# Patient Record
Sex: Male | Born: 1960 | Race: White | Hispanic: No | Marital: Married | Smoking: Never smoker
Health system: Southern US, Community
[De-identification: ages and names within clinical notes are randomized; demographics above are authoritative.]

## PROBLEM LIST (undated history)

## (undated) DIAGNOSIS — R079 Chest pain, unspecified: Secondary | ICD-10-CM

## (undated) DIAGNOSIS — E782 Mixed hyperlipidemia: Secondary | ICD-10-CM

## (undated) DIAGNOSIS — M150 Primary generalized (osteo)arthritis: Secondary | ICD-10-CM

## (undated) DIAGNOSIS — I214 Non-ST elevation (NSTEMI) myocardial infarction: Secondary | ICD-10-CM

## (undated) DIAGNOSIS — I2510 Atherosclerotic heart disease of native coronary artery without angina pectoris: Secondary | ICD-10-CM

## (undated) DIAGNOSIS — R0683 Snoring: Secondary | ICD-10-CM

## (undated) HISTORY — DX: Mixed hyperlipidemia: E78.2

## (undated) HISTORY — DX: Snoring: R06.83

## (undated) HISTORY — DX: Primary generalized (osteo)arthritis: M15.0

## (undated) HISTORY — DX: Chest pain, unspecified: R07.9

---
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
## 2015-04-24 DIAGNOSIS — E782 Mixed hyperlipidemia: Secondary | ICD-10-CM | POA: Insufficient documentation

## 2015-04-24 HISTORY — DX: Mixed hyperlipidemia: E78.2

## 2015-04-25 DIAGNOSIS — G4733 Obstructive sleep apnea (adult) (pediatric): Secondary | ICD-10-CM

## 2015-04-25 DIAGNOSIS — R0683 Snoring: Secondary | ICD-10-CM

## 2015-04-25 DIAGNOSIS — M8949 Other hypertrophic osteoarthropathy, multiple sites: Secondary | ICD-10-CM

## 2015-04-25 DIAGNOSIS — M150 Primary generalized (osteo)arthritis: Secondary | ICD-10-CM

## 2015-04-25 DIAGNOSIS — M159 Polyosteoarthritis, unspecified: Secondary | ICD-10-CM

## 2015-04-25 HISTORY — DX: Snoring: R06.83

## 2015-04-25 HISTORY — DX: Other hypertrophic osteoarthropathy, multiple sites: M89.49

## 2015-04-25 HISTORY — DX: Polyosteoarthritis, unspecified: M15.9

## 2015-04-25 HISTORY — DX: Primary generalized (osteo)arthritis: M15.0

## 2015-04-25 HISTORY — DX: Obstructive sleep apnea (adult) (pediatric): G47.33

## 2016-08-04 DIAGNOSIS — R079 Chest pain, unspecified: Secondary | ICD-10-CM

## 2016-08-04 HISTORY — DX: Chest pain, unspecified: R07.9

## 2016-08-05 ENCOUNTER — Emergency Department (HOSPITAL_COMMUNITY): Payer: BLUE CROSS/BLUE SHIELD

## 2016-08-05 ENCOUNTER — Encounter (HOSPITAL_COMMUNITY)
Admission: EM | Disposition: A | Discharge status: Home / Self Care | Payer: Self-pay | Source: Home / Self Care | Attending: Cardiovascular Disease

## 2016-08-05 ENCOUNTER — Ambulatory Visit: Payer: Self-pay | Admitting: Cardiology

## 2016-08-05 ENCOUNTER — Observation Stay (HOSPITAL_COMMUNITY)
Admission: EM | Admit: 2016-08-05 | Discharge: 2016-08-06 | Disposition: A | Discharge status: Home / Self Care | Payer: BLUE CROSS/BLUE SHIELD | Attending: Cardiovascular Disease | Admitting: Cardiovascular Disease

## 2016-08-05 ENCOUNTER — Encounter (HOSPITAL_COMMUNITY): Payer: Self-pay | Admitting: *Deleted

## 2016-08-05 DIAGNOSIS — E782 Mixed hyperlipidemia: Secondary | ICD-10-CM | POA: Diagnosis not present

## 2016-08-05 DIAGNOSIS — I25110 Atherosclerotic heart disease of native coronary artery with unstable angina pectoris: Secondary | ICD-10-CM | POA: Diagnosis not present

## 2016-08-05 DIAGNOSIS — I2510 Atherosclerotic heart disease of native coronary artery without angina pectoris: Secondary | ICD-10-CM | POA: Diagnosis present

## 2016-08-05 DIAGNOSIS — Z955 Presence of coronary angioplasty implant and graft: Secondary | ICD-10-CM

## 2016-08-05 DIAGNOSIS — Z8249 Family history of ischemic heart disease and other diseases of the circulatory system: Secondary | ICD-10-CM | POA: Diagnosis not present

## 2016-08-05 DIAGNOSIS — I200 Unstable angina: Secondary | ICD-10-CM

## 2016-08-05 DIAGNOSIS — M1991 Primary osteoarthritis, unspecified site: Secondary | ICD-10-CM | POA: Insufficient documentation

## 2016-08-05 DIAGNOSIS — I214 Non-ST elevation (NSTEMI) myocardial infarction: Principal | ICD-10-CM

## 2016-08-05 DIAGNOSIS — Z9861 Coronary angioplasty status: Secondary | ICD-10-CM

## 2016-08-05 HISTORY — DX: Non-ST elevation (NSTEMI) myocardial infarction: I21.4

## 2016-08-05 HISTORY — PX: LEFT HEART CATH AND CORONARY ANGIOGRAPHY: CATH118249

## 2016-08-05 HISTORY — PX: CORONARY ANGIOPLASTY WITH STENT PLACEMENT: SHX49

## 2016-08-05 HISTORY — PX: CORONARY STENT INTERVENTION: CATH118234

## 2016-08-05 HISTORY — DX: Atherosclerotic heart disease of native coronary artery without angina pectoris: I25.10

## 2016-08-05 LAB — BASIC METABOLIC PANEL
ANION GAP: 8 (ref 5–15)
BUN: 20 mg/dL (ref 6–20)
CALCIUM: 9.2 mg/dL (ref 8.9–10.3)
CO2: 28 mmol/L (ref 22–32)
Chloride: 104 mmol/L (ref 101–111)
Creatinine, Ser: 1.12 mg/dL (ref 0.61–1.24)
Glucose, Bld: 114 mg/dL — ABNORMAL HIGH (ref 65–99)
Potassium: 4.4 mmol/L (ref 3.5–5.1)
SODIUM: 140 mmol/L (ref 135–145)

## 2016-08-05 LAB — CBC
HCT: 46.4 % (ref 39.0–52.0)
HEMOGLOBIN: 15.8 g/dL (ref 13.0–17.0)
MCH: 30 pg (ref 26.0–34.0)
MCHC: 34.1 g/dL (ref 30.0–36.0)
MCV: 88 fL (ref 78.0–100.0)
Platelets: 172 10*3/uL (ref 150–400)
RBC: 5.27 MIL/uL (ref 4.22–5.81)
RDW: 13.4 % (ref 11.5–15.5)
WBC: 7.2 10*3/uL (ref 4.0–10.5)

## 2016-08-05 LAB — POCT ACTIVATED CLOTTING TIME: Activated Clotting Time: 549 seconds

## 2016-08-05 LAB — I-STAT TROPONIN, ED: TROPONIN I, POC: 0.22 ng/mL — AB (ref 0.00–0.08)

## 2016-08-05 LAB — TROPONIN I
TROPONIN I: 0.37 ng/mL — AB (ref <0.03)
Troponin I: 0.59 ng/mL (ref <0.03)

## 2016-08-05 SURGERY — LEFT HEART CATH AND CORONARY ANGIOGRAPHY
Anesthesia: LOCAL

## 2016-08-05 MED ORDER — SODIUM CHLORIDE 0.9% FLUSH: 3.0000 mL | INTRAVENOUS | Status: DC | PRN

## 2016-08-05 MED ORDER — MORPHINE SULFATE (PF) 4 MG/ML IV SOLN: 2.0000 mg | INTRAVENOUS | Status: DC | PRN

## 2016-08-05 MED ORDER — LABETALOL HCL 5 MG/ML IV SOLN: 10.0000 mg | INTRAVENOUS | Status: DC | PRN

## 2016-08-05 MED ORDER — HEART ATTACK BOUNCING BOOK
Freq: Once | Status: AC
  Administered 2016-08-06: 02:00:00
  Filled 2016-08-05: qty 1

## 2016-08-05 MED ORDER — BIVALIRUDIN TRIFLUOROACETATE 250 MG IV SOLR
INTRAVENOUS | Status: AC
  Filled 2016-08-05: qty 250

## 2016-08-05 MED ORDER — SODIUM CHLORIDE 0.9% FLUSH
3.0000 mL | Freq: Two times a day (BID) | INTRAVENOUS | Status: DC
  Administered 2016-08-06: 02:00:00 3 mL via INTRAVENOUS

## 2016-08-05 MED ORDER — ATORVASTATIN CALCIUM 40 MG PO TABS
40.0000 mg | ORAL_TABLET | Freq: Every day | ORAL | Status: DC
  Administered 2016-08-05: 40 mg via ORAL
  Filled 2016-08-05: qty 1

## 2016-08-05 MED ORDER — TICAGRELOR 90 MG PO TABS
90.0000 mg | ORAL_TABLET | Freq: Two times a day (BID) | ORAL | Status: DC
  Administered 2016-08-06 (×2): 90 mg via ORAL
  Filled 2016-08-05 (×2): qty 1

## 2016-08-05 MED ORDER — SODIUM CHLORIDE 0.9 % IV SOLN
1.7500 mg/kg/h | INTRAVENOUS | Status: DC
  Administered 2016-08-05: 16:00:00 1.75 mg/kg/h via INTRAVENOUS
  Filled 2016-08-05: qty 250

## 2016-08-05 MED ORDER — IOPAMIDOL (ISOVUE-370) INJECTION 76%
INTRAVENOUS | Status: AC
  Filled 2016-08-05: qty 100

## 2016-08-05 MED ORDER — HYDRALAZINE HCL 20 MG/ML IJ SOLN
5.0000 mg | INTRAMUSCULAR | Status: DC | PRN
  Administered 2016-08-05: 17:00:00 5 mg via INTRAVENOUS
  Filled 2016-08-05: qty 1

## 2016-08-05 MED ORDER — SODIUM CHLORIDE 0.9 % WEIGHT BASED INFUSION: 3.0000 mL/kg/h | INTRAVENOUS | Status: DC

## 2016-08-05 MED ORDER — FENTANYL CITRATE (PF) 100 MCG/2ML IJ SOLN
INTRAMUSCULAR | Status: AC
  Filled 2016-08-05: qty 2

## 2016-08-05 MED ORDER — SODIUM CHLORIDE 0.9% FLUSH: 3.0000 mL | Freq: Two times a day (BID) | INTRAVENOUS | Status: DC

## 2016-08-05 MED ORDER — HEPARIN SODIUM (PORCINE) 1000 UNIT/ML IJ SOLN
INTRAMUSCULAR | Status: DC | PRN
  Administered 2016-08-05: 5000 [IU] via INTRAVENOUS

## 2016-08-05 MED ORDER — MIDAZOLAM HCL 2 MG/2ML IJ SOLN
INTRAMUSCULAR | Status: AC
  Filled 2016-08-05: qty 2

## 2016-08-05 MED ORDER — LIDOCAINE HCL (PF) 1 % IJ SOLN
INTRAMUSCULAR | Status: DC | PRN
  Administered 2016-08-05: 2 mL

## 2016-08-05 MED ORDER — TICAGRELOR 90 MG PO TABS
ORAL_TABLET | ORAL | Status: DC | PRN
  Administered 2016-08-05: 180 mg via ORAL

## 2016-08-05 MED ORDER — ACETAMINOPHEN 325 MG PO TABS: 650.0000 mg | ORAL_TABLET | ORAL | Status: DC | PRN

## 2016-08-05 MED ORDER — ASPIRIN 81 MG PO CHEW
324.0000 mg | CHEWABLE_TABLET | Freq: Once | ORAL | Status: AC
  Administered 2016-08-05: 324 mg via ORAL
  Filled 2016-08-05: qty 4

## 2016-08-05 MED ORDER — SODIUM CHLORIDE 0.9 % IV SOLN: 250.0000 mL | INTRAVENOUS | Status: DC | PRN

## 2016-08-05 MED ORDER — SODIUM CHLORIDE 0.9 % IV SOLN: INTRAVENOUS | Status: AC

## 2016-08-05 MED ORDER — HEPARIN BOLUS VIA INFUSION
4000.0000 [IU] | Freq: Once | INTRAVENOUS | Status: AC
  Administered 2016-08-05: 4000 [IU] via INTRAVENOUS
  Filled 2016-08-05: qty 4000

## 2016-08-05 MED ORDER — NITROGLYCERIN 0.4 MG SL SUBL: 0.4000 mg | SUBLINGUAL_TABLET | SUBLINGUAL | Status: DC | PRN

## 2016-08-05 MED ORDER — SODIUM CHLORIDE 0.9 % IV SOLN
INTRAVENOUS | Status: AC | PRN
  Administered 2016-08-05 (×2): 1.75 mg/kg/h via INTRAVENOUS

## 2016-08-05 MED ORDER — VERAPAMIL HCL 2.5 MG/ML IV SOLN
INTRA_ARTERIAL | Status: DC | PRN
  Administered 2016-08-05: 5 mL via INTRA_ARTERIAL

## 2016-08-05 MED ORDER — VERAPAMIL HCL 2.5 MG/ML IV SOLN
INTRAVENOUS | Status: AC
  Filled 2016-08-05: qty 2

## 2016-08-05 MED ORDER — ASPIRIN 300 MG RE SUPP: 300.0000 mg | RECTAL | Status: DC

## 2016-08-05 MED ORDER — ASPIRIN 81 MG PO CHEW: 324.0000 mg | CHEWABLE_TABLET | ORAL | Status: DC

## 2016-08-05 MED ORDER — ANGIOPLASTY BOOK
Freq: Once | Status: AC
  Administered 2016-08-06: 02:00:00
  Filled 2016-08-05: qty 1

## 2016-08-05 MED ORDER — BIVALIRUDIN BOLUS VIA INFUSION - CUPID
INTRAVENOUS | Status: DC | PRN
  Administered 2016-08-05: 78.225 mg via INTRAVENOUS

## 2016-08-05 MED ORDER — ASPIRIN EC 81 MG PO TBEC
81.0000 mg | DELAYED_RELEASE_TABLET | Freq: Every day | ORAL | Status: DC
  Administered 2016-08-06: 81 mg via ORAL
  Filled 2016-08-05: qty 1

## 2016-08-05 MED ORDER — MIDAZOLAM HCL 2 MG/2ML IJ SOLN
INTRAMUSCULAR | Status: DC | PRN
  Administered 2016-08-05 (×2): 1 mg via INTRAVENOUS

## 2016-08-05 MED ORDER — FENTANYL CITRATE (PF) 100 MCG/2ML IJ SOLN
INTRAMUSCULAR | Status: DC | PRN
  Administered 2016-08-05 (×2): 25 ug via INTRAVENOUS

## 2016-08-05 MED ORDER — HEPARIN (PORCINE) IN NACL 100-0.45 UNIT/ML-% IJ SOLN
1200.0000 [IU]/h | INTRAMUSCULAR | Status: DC
  Administered 2016-08-05: 1200 [IU]/h via INTRAVENOUS
  Filled 2016-08-05: qty 250

## 2016-08-05 MED ORDER — HEPARIN SODIUM (PORCINE) 1000 UNIT/ML IJ SOLN
INTRAMUSCULAR | Status: AC
  Filled 2016-08-05: qty 1

## 2016-08-05 MED ORDER — SODIUM CHLORIDE 0.9 % WEIGHT BASED INFUSION: 1.0000 mL/kg/h | INTRAVENOUS | Status: DC

## 2016-08-05 MED ORDER — ASPIRIN 81 MG PO CHEW: 81.0000 mg | CHEWABLE_TABLET | Freq: Every day | ORAL | Status: DC

## 2016-08-05 MED ORDER — NITROGLYCERIN 1 MG/10 ML FOR IR/CATH LAB
INTRA_ARTERIAL | Status: DC | PRN
  Administered 2016-08-05 (×3): 200 ug via INTRACORONARY

## 2016-08-05 MED ORDER — IOPAMIDOL (ISOVUE-370) INJECTION 76%
INTRAVENOUS | Status: DC | PRN
  Administered 2016-08-05: 175 mL via INTRAVENOUS

## 2016-08-05 MED ORDER — TICAGRELOR 90 MG PO TABS
ORAL_TABLET | ORAL | Status: AC
  Filled 2016-08-05: qty 2

## 2016-08-05 MED ORDER — ONDANSETRON HCL 4 MG/2ML IJ SOLN: 4.0000 mg | Freq: Four times a day (QID) | INTRAMUSCULAR | Status: DC | PRN

## 2016-08-05 MED ORDER — LIDOCAINE HCL (PF) 1 % IJ SOLN
INTRAMUSCULAR | Status: AC
  Filled 2016-08-05: qty 30

## 2016-08-05 MED ORDER — NITROGLYCERIN 1 MG/10 ML FOR IR/CATH LAB
INTRA_ARTERIAL | Status: AC
  Filled 2016-08-05: qty 10

## 2016-08-05 MED ORDER — HEPARIN (PORCINE) IN NACL 2-0.9 UNIT/ML-% IJ SOLN
INTRAMUSCULAR | Status: AC | PRN
  Administered 2016-08-05: 1000 mL

## 2016-08-05 MED ORDER — HEPARIN (PORCINE) IN NACL 2-0.9 UNIT/ML-% IJ SOLN
INTRAMUSCULAR | Status: AC
  Filled 2016-08-05: qty 1000

## 2016-08-05 MED ORDER — ASPIRIN 81 MG PO CHEW: 81.0000 mg | CHEWABLE_TABLET | ORAL | Status: DC

## 2016-08-05 SURGICAL SUPPLY — 19 items
BALLN EMERGE MR 2.0X12 (BALLOONS) ×2
BALLOON EMERGE MR 2.0X12 (BALLOONS) ×1 IMPLANT
CATH INFINITI 5FR ANG PIGTAIL (CATHETERS) ×2 IMPLANT
CATH OPTITORQUE TIG 4.0 5F (CATHETERS) ×2 IMPLANT
CATH VISTA GUIDE 6FR JR4 (CATHETERS) ×2 IMPLANT
DEVICE RAD COMP TR BAND LRG (VASCULAR PRODUCTS) ×2 IMPLANT
GLIDESHEATH SLEND A-KIT 6F 22G (SHEATH) ×2 IMPLANT
GUIDEWIRE INQWIRE 1.5J.035X260 (WIRE) ×1 IMPLANT
INQWIRE 1.5J .035X260CM (WIRE) ×2
KIT ENCORE 26 ADVANTAGE (KITS) ×2 IMPLANT
KIT HEART LEFT (KITS) ×2 IMPLANT
PACK CARDIAC CATHETERIZATION (CUSTOM PROCEDURE TRAY) ×2 IMPLANT
STENT SYNERGY DES 3.5X16 (Permanent Stent) ×2 IMPLANT
STENT SYNERGY DES 3.5X20 (Permanent Stent) ×2 IMPLANT
SYR MEDRAD MARK V 150ML (SYRINGE) ×2 IMPLANT
TRANSDUCER W/STOPCOCK (MISCELLANEOUS) ×2 IMPLANT
TUBING CIL FLEX 10 FLL-RA (TUBING) ×2 IMPLANT
WIRE ASAHI PROWATER 180CM (WIRE) ×2 IMPLANT
WIRE HI TORQ VERSACORE-J 145CM (WIRE) ×2 IMPLANT

## 2016-08-05 NOTE — ED Provider Notes (Signed)
[facility]ENCY DEPT Provider Note   CSN: [id number] Arrival date & time: 08/05/16  0731     History   Chief Complaint Chief Complaint  Patient presents with  . Chest Pain    HPI Keith Hunter is a 56 y.o. male.   Chest Pain   This is a new problem. The current episode started more than 2 days ago. The problem occurs daily. The pain is associated with exertion. The pain is present in the substernal region. The pain is severe. The quality of the pain is described as exertional. The pain radiates to the left shoulder. The symptoms are aggravated by exertion.    Past Medical History:  Diagnosis Date  . Chest pain in adult 08/04/2016  . Mixed hyperlipidemia 04/24/2015   Last Assessment & Plan:  Relevant Hx: Course: Daily Update: Today's Plan:this is going to be updated today and will see how he is with this  Electronically signed by: Krystal ClarkMelissa Joyce Brown-Patram, NP 04/25/15 1119  . Primary osteoarthritis involving multiple joints 04/25/2015   Last Assessment & Plan:  Relevant Hx: Course: Daily Update: Today's Plan:discussed this in depth with him and he is using some OTC meds for this when he golfs and he has tried glucosamine which did not seem to help him much at all  Electronically signed by: Krystal ClarkMelissa Joyce Brown-Patram, NP 04/25/15 1118  . Snoring 04/25/2015   Last Assessment & Plan:  Relevant Hx: Course: Daily Update: Today's Plan:long discussion with him about this and he is advised that he should have sleep study which he does not want to and is going to investigate having mouth piece  Electronically signed by: Krystal ClarkMelissa Joyce Brown-Patram, NP 04/25/15 1122    Patient Active Problem List   Diagnosis Date Noted  . Chest pain in adult 08/04/2016  . Primary osteoarthritis involving multiple joints 04/25/2015  . Snoring 04/25/2015  . Mixed hyperlipidemia 04/24/2015    History reviewed. No pertinent surgical history.     Home Medications    Prior to Admission medications   Not on  File    Family History No family history on file.  Social History Social History  Substance Use Topics  . Smoking status: Never Smoker  . Smokeless tobacco: Never Used  . Alcohol use No     Allergies   Patient has no known allergies.   Review of Systems Review of Systems  Cardiovascular: Positive for chest pain.  All other systems reviewed and are negative.    Physical Exam Updated Vital Signs Pulse 76   Temp 97.8 F (36.6 C)   Resp 18   Ht 5\' 10"  (1.778 m)   Wt 104.3 kg (230 lb)   SpO2 100%   BMI 33.00 kg/m   Physical Exam  Constitutional: He is oriented to person, place, and time. He appears well-developed and well-nourished.  HENT:  Head: Normocephalic and atraumatic.  Eyes: Conjunctivae and EOM are normal.  Neck: Normal range of motion.  Cardiovascular: Normal rate.   Pulmonary/Chest: Effort normal. No respiratory distress.  Abdominal: Soft. Bowel sounds are normal. He exhibits no distension.  Musculoskeletal: Normal range of motion.  Neurological: He is alert and oriented to person, place, and time.  Skin: Skin is warm and dry.  Nursing note and vitals reviewed.    ED Treatments / Results  Labs (all labs ordered are listed, but only abnormal results are displayed) Labs Reviewed  BASIC METABOLIC PANEL - Abnormal; Notable for the following:       Result Value  Glucose, Bld 114 (*)    All other components within normal limits  I-STAT TROPOININ, ED - Abnormal; Notable for the following:    Troponin i, poc 0.22 (*)    All other components within normal limits  CBC  HEPARIN LEVEL (UNFRACTIONATED)    EKG  EKG Interpretation  Date/Time:  Wednesday August 05 2016 07:46:41 EDT Ventricular Rate:  73 PR Interval:    QRS Duration: 95 QT Interval:  365 QTC Calculation: 403 R Axis:   -8 Text Interpretation:  Age not entered, assumed to be  56 years old for purpose of ECG interpretation Sinus rhythm Low voltage, precordial leads TWI in III,  nonspecific in avF No old tracing to compare Confirmed by Marily Memos [id number]) on 08/05/2016 7:55:24 AM       Radiology Dg Chest 2 View  Result Date: 08/05/2016 CLINICAL DATA:  Chest pain. EXAM: CHEST  2 VIEW COMPARISON:  No prior . FINDINGS: Mediastinum and hilar structures normal. Heart size normal. No focal infiltrate. No pleural effusion or pneumothorax. Surgical screw noted over the right shoulder . IMPRESSION: No acute cardiopulmonary disease. Electronically Signed   By: Maisie Fus  Register   On: 08/05/2016 08:32    Procedures Procedures (including critical care time)  CRITICAL CARE Performed by: Marily Memos Total critical care time: 35 minutes Critical care time was exclusive of separately billable procedures and treating other patients. Critical care was necessary to treat or prevent imminent or life-threatening deterioration. Critical care was time spent personally by me on the following activities: development of treatment plan with patient and/or surrogate as well as nursing, discussions with consultants, evaluation of patient's response to treatment, examination of patient, obtaining history from patient or surrogate, ordering and performing treatments and interventions, ordering and review of laboratory studies, ordering and review of radiographic studies, pulse oximetry and re-evaluation of patient's condition.   Medications Ordered in ED Medications  aspirin chewable tablet 324 mg (not administered)  heparin bolus via infusion 4,000 Units (not administered)  heparin ADULT infusion 100 units/mL (25000 units/252mL sodium chloride 0.45%) (not administered)     Initial Impression / Assessment and Plan / ED Course  I have reviewed the triage vital signs and the nursing notes.  Pertinent labs & imaging results that were available during my care of the patient were reviewed by me and considered in my medical decision making (see chart for details).  Clinical Course as of Aug 06 850  Wed Aug 05, 2016  0815 ED EKG within 10 minutes [DP]  0815 EKG 12-Lead [DP]    Clinical Course User Index [DP] Plocki, Dylan T, Student-PA    NSTEMI. Heparin started. Plan for cardiology consult and admission.   Final Clinical Impressions(s) / ED Diagnoses   Final diagnoses:  NSTEMI (non-ST elevated myocardial infarction) (HCC)     Shayla Heming, Barbara Cower, MD 08/05/16 1658

## 2016-08-05 NOTE — ED Notes (Signed)
Pt transported to cath lab with RN and NT following cath lab call for patient. Due to pt movement within the ED prior to Consent not signed prior to transport as it would have led to a delay in transport.

## 2016-08-05 NOTE — Care Management Note (Signed)
Case Management Note  Patient Details  Name: Keith Hunter MRN: [id number] Date of Birth: 07/07/1960  Subjective/Objective:  From home with wife, pta indep,  elevated trops, NSTEMI, s/p coronary stent, will be on brilinta, NCM awaiting benefit check.                   Action/Plan: NCM will follow for dc needs.   Expected Discharge Date:                  Expected Discharge Plan:  Home/Self Care  In-House Referral:     Discharge planning Services  CM Consult  Post Acute Care Choice:    Choice offered to:     DME Arranged:    DME Agency:     HH Arranged:    HH Agency:     Status of Service:  In process, will continue to follow  If discussed at Long Length of Stay Meetings, dates discussed:    Additional Comments:  [name], [name], RN 08/05/2016, 3:45 PM

## 2016-08-05 NOTE — H&P (Signed)
History & Physical    Patient ID: Keith Hunter MRN: [id number], DOB/AGE: [birth year]   Admit date: 08/05/2016   Primary Physician: Gordan Payment., MD Primary Cardiologist: New  Patient Profile    56 yo male with no significant PMH who presented with exertional chest pain and dyspnea.   Past Medical History   Past Medical History:  Diagnosis Date  . Chest pain in adult 08/04/2016  . Mixed hyperlipidemia 04/24/2015   Last Assessment & Plan:  Relevant Hx: Course: Daily Update: Today's Plan:this is going to be updated today and will see how he is with this  Electronically signed by: Krystal Clark, NP 04/25/15 1119  . Primary osteoarthritis involving multiple joints 04/25/2015   Last Assessment & Plan:  Relevant Hx: Course: Daily Update: Today's Plan:discussed this in depth with him and he is using some OTC meds for this when he golfs and he has tried glucosamine which did not seem to help him much at all  Electronically signed by: Krystal Clark, NP 04/25/15 1118  . Snoring 04/25/2015   Last Assessment & Plan:  Relevant Hx: Course: Daily Update: Today's Plan:long discussion with him about this and he is advised that he should have sleep study which he does not want to and is going to investigate having mouth piece  Electronically signed by: Krystal Clark, NP 04/25/15 1122    History reviewed. No pertinent surgical history.   Allergies  No Known Allergies  History of Present Illness    56 yo male with no significant PMH, but follows routinely with PCP. Denies any tobacco use. Strong family hx of CAD with both father and brother having stents, brother was in his 15s. Currently works as a Facilities manager. Does not normally experience any exertional symptoms. He was playing golf on Sunday, while walking up hill on the course developed chest pressure with tightness radiating up into his neck. Relieved by rest. Felt it was GERD at the time. Then had recurrence  of symptoms Monday night while walking outside with his wife. Presented to his PCP office yesterday, and had labs done. Called this morning and told his labs were abnormal and to come to the ED.   In the ED labs showed trop 0.2, Cr 1.12, Hgb 15.8. EKG showed SR with TWI/Q waves in lead III. CXR negative. He was placed on IV heparin. No current chest pain.   Home Medications    Prior to Admission medications   Not on File    Family History    Family History  Problem Relation Age of Onset  . Heart disease Father   . Heart disease Brother     Social History    Social History   Social History  . Marital status: Married    Spouse name: N/A  . Number of children: N/A  . Years of education: N/A   Occupational History  . Not on file.   Social History Main Topics  . Smoking status: Never Smoker  . Smokeless tobacco: Never Used  . Alcohol use No  . Drug use: No  . Sexual activity: Not on file   Other Topics Concern  . Not on file   Social History Narrative  . No narrative on file     Review of Systems    See HPI  All other systems reviewed and are otherwise negative except as noted above.  Physical Exam    Blood pressure 127/90, pulse 70, temperature 97.8 F (36.6 C), resp.  rate 17, height 5\' 10"  (1.778 m), weight 230 lb (104.3 kg), SpO2 98 %.  General: Pleasant, NAD Psych: Normal affect. Neuro: Alert and oriented X 3. Moves all extremities spontaneously. HEENT: Normal  Neck: Supple without bruits or JVD. Lungs:  Resp regular and unlabored, CTA. Heart: RRR no s3, s4, or murmurs. Abdomen: Soft, non-tender, non-distended, BS + x 4.  Extremities: No clubbing, cyanosis or edema. DP/PT/Radials 2+ and equal bilaterally.  Labs    Troponin [facility](Point of Care Test)  Recent Labs  08/05/16 0803  TROPIPOC 0.22*   No results for input(s): CKTOTAL, CKMB, TROPONINI in the last 72 hours. Lab Results  Component Value Date   WBC 7.2 08/05/2016   HGB 15.8 08/05/2016    HCT 46.4 08/05/2016   MCV 88.0 08/05/2016   PLT 172 08/05/2016     Recent Labs Lab 08/05/16 0750  NA 140  K 4.4  CL 104  CO2 28  BUN 20  CREATININE 1.12  CALCIUM 9.2  GLUCOSE 114*   No results found for: CHOL, HDL, LDLCALC, TRIG No results found for: [facility]   Radiology Studies    Dg Chest 2 View  Result Date: 08/05/2016 CLINICAL DATA:  Chest pain. EXAM: CHEST  2 VIEW COMPARISON:  No prior . FINDINGS: Mediastinum and hilar structures normal. Heart size normal. No focal infiltrate. No pleural effusion or pneumothorax. Surgical screw noted over the right shoulder . IMPRESSION: No acute cardiopulmonary disease. Electronically Signed   By: Maisie Fushomas  Register   On: 08/05/2016 08:32    ECG & Cardiac Imaging    EKG: SR with TWI, Q waves in lead III  Assessment & Plan    56 yo male with no significant PMH who presented with exertional chest pain and dyspnea.   1. NSTEMI/chest pain: Reports 2 separate episodes of exertional chest pain/tightness with radiation into his neck. Felt it was GERD. Went to PCP and had labs done with elevated troponin. Instructed to come to the ED. Initial Trop 0.22. Significant family Hx with brother/father having stents.  -- plan for LHC today. The patient understands that risks included but are not limited to stroke (1 in 1000), death (1 in 1000), kidney failure [usually temporary] (1 in 500), bleeding (1 in 200), allergic reaction [possibly serious] (1 in 200).  -- IV heparin -- Hgb A1c was 5.8  2. HL: Labs from PCP office recently showed LDL 128. Will start statin therapy with Lipitor 40mg  daily  Signed, Laverda PageLindsay Roberts, NP-C Pager [phone] 08/05/2016, 9:38 AM   Agree with note by Laverda PageLindsay Roberts NP-C  Patient with positive risk factors, new onset exertional chest pain, slightly abnormal EKG and minimally abnormal troponin. His exam is benign. I believe the best diagnostic strategy would be coronary angiography. The patient understands that  risks included but are not limited to stroke (1 in 1000), death (1 in 1000), kidney failure [usually temporary] (1 in 500), bleeding (1 in 200), allergic reaction [possibly serious] (1 in 200). The patient understands and agrees to proceed   Runell GessJonathan J. Hilaria Titsworth, M.D., FACP, [facility] CenterFACC, Kathryne ErikssonFAHA, FSCAI [facility] [address] [place], [place]  [id number]  [phone] 08/05/2016 1:34 PM

## 2016-08-05 NOTE — Progress Notes (Signed)
ANTICOAGULATION CONSULT NOTE - Initial Consult  Pharmacy Consult for heparin Indication: chest pain/ACS  No Known Allergies  Patient Measurements: Height: 5\' 10"  (177.8 cm) Weight: 230 lb (104.3 kg) IBW/kg (Calculated) : 73 Heparin Dosing Weight: 95.2  Vital Signs: Temp: 97.8 F (36.6 C) (07/18 0743) Pulse Rate: 76 (07/18 0743)  Labs:  Recent Labs  08/05/16 0750  HGB 15.8  HCT 46.4  PLT 172  CREATININE 1.12    Estimated Creatinine Clearance: 91.2 mL/min (by C-G formula based on SCr of 1.12 mg/dL).   Medical History: Past Medical History:  Diagnosis Date  . Chest pain in adult 08/04/2016  . Mixed hyperlipidemia 04/24/2015   Last Assessment & Plan:  Relevant Hx: Course: Daily Update: Today's Plan:this is going to be updated today and will see how he is with this  Electronically signed by: Krystal ClarkMelissa Joyce Brown-Patram, NP 04/25/15 1119  . Primary osteoarthritis involving multiple joints 04/25/2015   Last Assessment & Plan:  Relevant Hx: Course: Daily Update: Today's Plan:discussed this in depth with him and he is using some OTC meds for this when he golfs and he has tried glucosamine which did not seem to help him much at all  Electronically signed by: Krystal ClarkMelissa Joyce Brown-Patram, NP 04/25/15 1118  . Snoring 04/25/2015   Last Assessment & Plan:  Relevant Hx: Course: Daily Update: Today's Plan:long discussion with him about this and he is advised that he should have sleep study which he does not want to and is going to investigate having mouth piece  Electronically signed by: Krystal ClarkMelissa Joyce Brown-Patram, NP 04/25/15 1122    Medications:  Infusions:  . heparin      Assessment: 56 YOM presenting with chest pain with consult for heparin.      Goal of Therapy:  Heparin level 0.3-0.7 units/ml Monitor platelets by anticoagulation protocol: Yes   Plan:  Start heparin 4000 units x 1 Start heparin gtt 1200 units/hr Heparin level @ 1600 Daily heparin level, CBC, monitor s/s  bleeding  Daylene PoseyJonathan Hashem Goynes, PharmD Pharmacy Resident Pager #: [phone] 08/05/2016 10:14 AM

## 2016-08-05 NOTE — ED Triage Notes (Addendum)
Pt was seen by pcp after experiencing chest pain while walking up hill on Sunday and Monday.  Md drew labs yesterday and told him to come in b/c "troponin was elevated" per pt.  States L shoulder pain x 1 year.

## 2016-08-05 NOTE — Progress Notes (Signed)
#   5 S/W JENAE @ [facility] # [phone]   BRILINTA 90 MG DAILY   COVER- YES  CO-PAY- $ 55.00  TIER- 3 DRUG  PRIOR APPROVAL- NO   PHARMACY : [facility]

## 2016-08-05 NOTE — Interval H&P Note (Signed)
Cath Lab Visit (complete for each Cath Lab visit)  Clinical Evaluation Leading to the Procedure:   ACS: Yes.    Non-ACS:    Anginal Classification: CCS III  Anti-ischemic medical therapy: No Therapy  Non-Invasive Test Results: No non-invasive testing performed  Prior CABG: No previous CABG      History and Physical Interval Note:  08/05/2016 1:37 PM  Shaul Amundson  has presented today for surgery, with the diagnosis of unstable angina  The various methods of treatment have been discussed with the patient and family. After consideration of risks, benefits and other options for treatment, the patient has consented to  Procedure(s): Left Heart Cath and Coronary Angiography (N/A) as a surgical intervention .  The patient's history has been reviewed, patient examined, no change in status, stable for surgery.  I have reviewed the patient's chart and labs.  Questions were answered to the patient's satisfaction.     Nanetta BattyBerry, Kambri Dismore

## 2016-08-06 ENCOUNTER — Encounter (HOSPITAL_COMMUNITY): Payer: Self-pay | Admitting: Cardiovascular Disease

## 2016-08-06 DIAGNOSIS — I200 Unstable angina: Secondary | ICD-10-CM | POA: Diagnosis not present

## 2016-08-06 DIAGNOSIS — Z8249 Family history of ischemic heart disease and other diseases of the circulatory system: Secondary | ICD-10-CM | POA: Diagnosis not present

## 2016-08-06 DIAGNOSIS — I25110 Atherosclerotic heart disease of native coronary artery with unstable angina pectoris: Secondary | ICD-10-CM | POA: Diagnosis not present

## 2016-08-06 DIAGNOSIS — I214 Non-ST elevation (NSTEMI) myocardial infarction: Secondary | ICD-10-CM

## 2016-08-06 DIAGNOSIS — I2510 Atherosclerotic heart disease of native coronary artery without angina pectoris: Secondary | ICD-10-CM

## 2016-08-06 DIAGNOSIS — Z9861 Coronary angioplasty status: Secondary | ICD-10-CM

## 2016-08-06 DIAGNOSIS — E782 Mixed hyperlipidemia: Secondary | ICD-10-CM | POA: Diagnosis not present

## 2016-08-06 HISTORY — DX: Atherosclerotic heart disease of native coronary artery without angina pectoris: Z98.61

## 2016-08-06 HISTORY — DX: Non-ST elevation (NSTEMI) myocardial infarction: I21.4

## 2016-08-06 HISTORY — DX: Atherosclerotic heart disease of native coronary artery without angina pectoris: I25.10

## 2016-08-06 LAB — BASIC METABOLIC PANEL
Anion gap: 5 (ref 5–15)
BUN: 15 mg/dL (ref 6–20)
CHLORIDE: 107 mmol/L (ref 101–111)
CO2: 26 mmol/L (ref 22–32)
CREATININE: 1.1 mg/dL (ref 0.61–1.24)
Calcium: 8.8 mg/dL — ABNORMAL LOW (ref 8.9–10.3)
Glucose, Bld: 104 mg/dL — ABNORMAL HIGH (ref 65–99)
POTASSIUM: 4.2 mmol/L (ref 3.5–5.1)
SODIUM: 138 mmol/L (ref 135–145)

## 2016-08-06 LAB — CBC
HCT: 44.9 % (ref 39.0–52.0)
Hemoglobin: 15.1 g/dL (ref 13.0–17.0)
MCH: 29.6 pg (ref 26.0–34.0)
MCHC: 33.6 g/dL (ref 30.0–36.0)
MCV: 88 fL (ref 78.0–100.0)
PLATELETS: 159 10*3/uL (ref 150–400)
RBC: 5.1 MIL/uL (ref 4.22–5.81)
RDW: 13.8 % (ref 11.5–15.5)
WBC: 9.8 10*3/uL (ref 4.0–10.5)

## 2016-08-06 LAB — TROPONIN I: TROPONIN I: 0.83 ng/mL — AB (ref <0.03)

## 2016-08-06 MED ORDER — NITROGLYCERIN 0.4 MG SL SUBL: 0.4000 mg | SUBLINGUAL_TABLET | SUBLINGUAL | 12 refills | Status: AC | PRN

## 2016-08-06 MED ORDER — ATORVASTATIN CALCIUM 80 MG PO TABS: 80.0000 mg | ORAL_TABLET | Freq: Every day | ORAL | 11 refills | Status: DC

## 2016-08-06 MED ORDER — ASPIRIN 81 MG PO TBEC: 81.0000 mg | DELAYED_RELEASE_TABLET | Freq: Every day | ORAL | Status: DC

## 2016-08-06 MED ORDER — TICAGRELOR 90 MG PO TABS: 90.0000 mg | ORAL_TABLET | Freq: Two times a day (BID) | ORAL | 11 refills | Status: DC

## 2016-08-06 NOTE — Progress Notes (Signed)
CARDIAC REHAB PHASE I   PRE:  Rate/Rhythm: 67 SR  BP:  Sitting: 137/75        SaO2: 95 RA  MODE:  Ambulation: 800 ft   POST:  Rate/Rhythm: 86 SR  BP:  Sitting: 173/96         SaO2: 97 RA  Pt ambulated 800 ft on RA, independent, steady gait, tolerated well with no complaints other than some shortness of breath he states he thinks is due to brilinta. Completed MI/stent education with pt and wife at bedside.  Reviewed risk factors, MI/PCI books, anti-platelet therapy, stent card, activity restrictions, ntg, exercise, heart healthy diet and phase 2 cardiac rehab. Pt verbalized understanding, receptive. Pt agrees to phase 2 cardiac rehab referral, [facility]. Case manager to follow up regarding brilinta. Pt to bed per pt request after walk, call bell within reach.     [phone] Joylene GrapesEmily C Franki Stemen, RN, BSN 08/06/2016 9:39 AM

## 2016-08-06 NOTE — Progress Notes (Signed)
TR BAND REMOVAL  LOCATION:    right radial  DEFLATED PER PROTOCOL:    Yes.    TIME BAND OFF / DRESSING APPLIED:    2330   SITE UPON ARRIVAL:    Level 1  SITE AFTER BAND REMOVAL:    Level 1  CIRCULATION SENSATION AND MOVEMENT:    Within Normal Limits   Yes.    COMMENTS:   Radial site teaching reinforced. Teach back done.

## 2016-08-06 NOTE — Care Management Note (Signed)
Case Management Note  Patient Details  Name: Keith Hunter MRN: [id number] Date of Birth: July 08, 1960  Subjective/Objective:      From home with wife, pta indep,  elevated trops, NSTEMI, s/p coronary stent, will be on brilinta, NCM awaiting benefit check, co pay is 55.00, but NCM gave patient $5 co pay card, per benefit check there was no deductible to be met.  [facility] [address] [facility].  He is for dc today.                                Action/Plan:   Expected Discharge Date:  08/06/16               Expected Discharge Plan:  Home/Self Care  In-House Referral:     Discharge planning Services  CM Consult  Post Acute Care Choice:    Choice offered to:     DME Arranged:    DME Agency:     HH Arranged:    HH Agency:     Status of Service:  Completed, signed off  If discussed at [employer] of Stay Meetings, dates discussed:    Additional Comments:  Zenon Mayo, RN 08/06/2016, 9:43 AM

## 2016-08-06 NOTE — Progress Notes (Addendum)
Progress Note  Patient Name: Keith Hunter Date of Encounter: 08/06/2016  Primary Cardiologist: Allyson Sabal   Subjective   Postoperative day one RCA PCI and drug-eluting stenting in the setting of acute coronary syndrome with minimal troponin leak and normal LV function. No complaints this morning. He was having a little shortness of breath probably related to the [place]. His vital signs are stable.  Inpatient Medications    Scheduled Meds: . aspirin EC  81 mg Oral Daily  . atorvastatin  40 mg Oral q1800  . sodium chloride flush  3 mL Intravenous Q12H  . ticagrelor  90 mg Oral BID   Continuous Infusions: . sodium chloride     PRN Meds: sodium chloride, acetaminophen, morphine injection, nitroGLYCERIN, ondansetron (ZOFRAN) IV, sodium chloride flush   Vital Signs    Vitals:   08/05/16 2000 08/05/16 2030 08/05/16 2100 08/06/16 0646  BP: (!) 153/70 139/75 (!) 149/76 (!) 158/75  Pulse: 62 61 61 70  Resp: 14 16 12 15   Temp:  98.9 F (37.2 C)  98 F (36.7 C)  TempSrc:  Oral  Oral  SpO2: 97% 96% 97% 94%  Weight:    229 lb 4.5 oz (104 kg)  Height:        Intake/Output Summary (Last 24 hours) at 08/06/16 0820 Last data filed at 08/06/16 0300  Gross per 24 hour  Intake          2501.84 ml  Output             2175 ml  Net           326.84 ml   Filed Weights   08/05/16 0737 08/06/16 0646  Weight: 230 lb (104.3 kg) 229 lb 4.5 oz (104 kg)    Telemetry    Sinus rhythm without arrhythmia - Personally Reviewed  ECG    Normal sinus rhythm at 65 with poor R-wave progression - Personally Reviewed  Physical Exam   GEN: No acute distress.   Neck: No JVD Cardiac: RRR, no murmurs, rubs, or gallops.  Respiratory: Clear to auscultation bilaterally. GI: Soft, nontender, non-distended  MS: No edema; No deformity. Neuro:  Nonfocal  Psych: Normal affect   Labs    Chemistry Recent Labs Lab 08/05/16 0750 08/06/16 0312  NA 140 138  K 4.4 4.2  CL 104 107  CO2 28 26    GLUCOSE 114* 104*  BUN 20 15  CREATININE 1.12 1.10  CALCIUM 9.2 8.8*  GFRNONAA >60 >60  GFRAA >60 >60  ANIONGAP 8 5     Hematology Recent Labs Lab 08/05/16 0750 08/06/16 0312  WBC 7.2 9.8  RBC 5.27 5.10  HGB 15.8 15.1  HCT 46.4 44.9  MCV 88.0 88.0  MCH 30.0 29.6  MCHC 34.1 33.6  RDW 13.4 13.8  PLT 172 159    Cardiac Enzymes Recent Labs Lab 08/05/16 1528 08/05/16 2047 08/06/16 0312  TROPONINI 0.37* 0.59* 0.83*    Recent Labs Lab 08/05/16 0803  TROPIPOC 0.22*     BNPNo results for input(s): BNP, PROBNP in the last 168 hours.   DDimer No results for input(s): DDIMER in the last 168 hours.   Radiology    Dg Chest 2 View  Result Date: 08/05/2016 CLINICAL DATA:  Chest pain. EXAM: CHEST  2 VIEW COMPARISON:  No prior . FINDINGS: Mediastinum and hilar structures normal. Heart size normal. No focal infiltrate. No pleural effusion or pneumothorax. Surgical screw noted over the right shoulder . IMPRESSION: No acute cardiopulmonary disease.  Electronically Signed   By: Maisie Fushomas  Register   On: 08/05/2016 08:32    Cardiac Studies   Cardiac catheterization/intervention (08/05/16)  Conclusion     Mid RCA lesion, 50 %stenosed.  Prox RCA lesion, 99 %stenosed.  Post intervention, there is a 0% residual stenosis.  A stent was successfully placed.  The left ventricular systolic function is normal.  LV end diastolic pressure is normal.  The left ventricular ejection fraction is 55-65% by visual estimate.     Patient Profile     56 y.o. male postop day one RCA PCI and drug-eluting stenting with 2 overlapping synergy drug-eluting stents. He had no other stomach and CAD. His LV function was normal. Troponins were low. His EKG shows no acute changes. He was complaining of some shortness of breath possibly related to [place]. He is ambulating without limitation or symptoms this morning.  Assessment & Plan    1: Coronary artery disease-postop day #1 RCA PCI and  drug-eluting stenting using overlapping synergy drug-eluting stents. His remainder of his coronary anatomy was free of significant disease. His LV function was normal. Troponins were low and side. This radial puncture is stable. He ambulated without limitation though has complained of some shortness of breath possibly related to [place]. His troponins were rose slightly from 0.59---> 0.83 and not concerned about this. He stable for discharge home today.  Alphonsus SiasSigned, Telly Broberg, MD  08/06/2016, 8:20 AM

## 2016-08-06 NOTE — Consult Note (Signed)
Discharge Summary    Patient ID: Keith Hunter,  MRN: [id number], DOB/AGE: 01/27/1960 56 y.o.  Admit date: 08/05/2016 Discharge date: 08/06/2016  Primary Care Provider: Feliciana Rossetti A. Primary Cardiologist: Dr. Allyson Sabal  Discharge Diagnoses    Principal Problem:   Non-ST elevation (NSTEMI) myocardial infarction [facility]) Active Problems:   Mixed hyperlipidemia   Chest pain   Unstable angina (HCC)   CAD (coronary artery disease), native coronary artery   Allergies No Known Allergies  Diagnostic Studies/Procedures    Coronary Stent Intervention   08/05/16  Left Heart Cath and Coronary Angiography  Conclusion     Mid RCA lesion, 50 %stenosed.  Prox RCA lesion, 99 %stenosed.  Post intervention, there is a 0% residual stenosis.  A stent was successfully placed.  The left ventricular systolic function is normal.  LV end diastolic pressure is normal.  The left ventricular ejection fraction is 55-65% by visual estimate.  IMPRESSION: Successful proximal/mid dominant RCA PCI and drug-eluting stenting using 2 overlapping 3.5 mm synergy drug-eluting stents postdilated to 3.8 mm resulting in reduction of a proximal 99% stenosis to 0% residual. The patient has no other significant CAD. He has normal LV function. He will be gently hydrated and discharged home in the morning on dual antiplatelet therapy will remain on uninterrupted for 12 months.    Diagnostic Diagram       Post-Intervention Diagram         History of Present Illness     56 yo male with no significant PMH who presented with exertional chest pain and dyspnea.   Denies any tobacco use. Strong family hx of CAD with both father and brother having stents, brother was in his 71s. Currently works as a Facilities manager. Does not normally experience any exertional symptoms. He was playing golf on Sunday, while walking up hill on the course developed chest pressure with tightness radiating up into his neck. Relieved by  rest. Felt it was GERD at the time. Then had recurrence of symptoms Monday night while walking outside with his wife. Presented to his PCP office 7/17, and had labs done. Called AM of 7/18 and told his labs were abnormal and to come to the ED.   In the ED labs showed trop 0.2, Cr 1.12, Hgb 15.8. EKG showed SR with TWI/Q waves in lead III. CXR negative. He was placed on IV heparin. No current chest pain.   Hospital Course     Consultants: None  Admitted and taken to cath lab that showed 99% pRCA & 50%mRCA s/p PTCA and  overlapping synergy drug-eluting stents. His LV function was normal. His troponins were rose slightly from 0.59---> 0.83 and not concerned about this as his pain resolved. He ambulated without limitation though has complained of some shortness of breath possibly related to the Brilinta. Consider 90 days supply at follow up if tolerating well.  Started Lipitor 80mg  qd. LDL 128 at PCP office. HR low (in 60s) to add BB.   The patient has been seen by Dr. Allyson Sabal today and deemed ready for discharge home. All follow-up appointments have been scheduled. Discharge medications are listed below.   Discharge Vitals Blood pressure 133/88, pulse 66, temperature 97.9 F (36.6 C), temperature source Oral, resp. rate 15, height 5\' 10"  (1.778 m), weight 229 lb 4.5 oz (104 kg), SpO2 96 %.  Filed Weights   08/05/16 0737 08/06/16 0646  Weight: 230 lb (104.3 kg) 229 lb 4.5 oz (104 kg)    Labs &  Radiologic Studies     CBC  Recent Labs  08/05/16 0750 08/06/16 0312  WBC 7.2 9.8  HGB 15.8 15.1  HCT 46.4 44.9  MCV 88.0 88.0  PLT 172 159   Basic Metabolic Panel  Recent Labs  08/05/16 0750 08/06/16 0312  NA 140 138  K 4.4 4.2  CL 104 107  CO2 28 26  GLUCOSE 114* 104*  BUN 20 15  CREATININE 1.12 1.10  CALCIUM 9.2 8.8*   Liver Function Tests No results for input(s): AST, ALT, ALKPHOS, BILITOT, PROT, ALBUMIN in the last 72 hours. No results for input(s): LIPASE, AMYLASE in the  last 72 hours. Cardiac Enzymes  Recent Labs  08/05/16 1528 08/05/16 2047 08/06/16 0312  TROPONINI 0.37* 0.59* 0.83*     Dg Chest 2 View  Result Date: 08/05/2016 CLINICAL DATA:  Chest pain. EXAM: CHEST  2 VIEW COMPARISON:  No prior . FINDINGS: Mediastinum and hilar structures normal. Heart size normal. No focal infiltrate. No pleural effusion or pneumothorax. Surgical screw noted over the right shoulder . IMPRESSION: No acute cardiopulmonary disease. Electronically Signed   By: Maisie Fus  Register   On: 08/05/2016 08:32    Disposition   Pt is being discharged home today in good condition.  Follow-up Plans & Appointments    Follow-up Information    Abelino Derrick, [place]. Go on 08/21/2016.   Specialties:  Cardiology, Radiology Why:  @2 :30 pm for post hospital  Contact information: [address] [place] [address] [phone]          Discharge Instructions    AMB Referral to Cardiac Rehabilitation - Phase II    Complete by:  As directed    Diagnosis:  Coronary Stents   Diet - low sodium heart healthy    Complete by:  As directed    Discharge instructions    Complete by:  As directed    NO HEAVY LIFTING (>10lbs) X 2 WEEKS. NO SEXUAL ACTIVITY X 2 WEEKS. NO DRIVING X 1 WEEK. NO SOAKING BATHS, HOT TUBS, POOLS, ETC., X 7 DAYS.  Return to work on 7/25 if no recurrent chest pain.   Increase activity slowly    Complete by:  As directed       Discharge Medications   Current Discharge Medication List    START taking these medications   Details  aspirin EC 81 MG EC tablet Take 1 tablet (81 mg total) by mouth daily.    atorvastatin (LIPITOR) 80 MG tablet Take 1 tablet (80 mg total) by mouth daily at 6 PM. Qty: 30 tablet, Refills: 11    nitroGLYCERIN (NITROSTAT) 0.4 MG SL tablet Place 1 tablet (0.4 mg total) under the tongue every 5 (five) minutes x 3 doses as needed for chest pain. Qty: 25 tablet, Refills: 12    ticagrelor (BRILINTA) 90 MG TABS tablet  Take 1 tablet (90 mg total) by mouth 2 (two) times daily. Qty: 60 tablet, Refills: 11      CONTINUE these medications which have NOT CHANGED   Details  Flaxseed, Linseed, (FLAXSEED OIL PO) Take 1 capsule by mouth daily.    ibuprofen (ADVIL,MOTRIN) 200 MG tablet Take 200 mg by mouth every 6 (six) hours as needed for moderate pain.    Omega-3 Fatty Acids (FISH OIL PO) Take 1 capsule by mouth daily.      STOP taking these medications     NON FORMULARY          Aspirin prescribed at discharge?  Yes High Intensity Statin Prescribed? (Lipitor 40-80mg  or Crestor 20-40mg ): Yes Beta Blocker Prescribed? N/A - slow HR For EF 45% or less, Was ACEI/ARB Prescribed? N/A ADP Receptor Inhibitor Prescribed? (i.e. Plavix etc.-Includes Medically Managed Patients): Yes For EF <40%, Aldosterone Inhibitor Prescribed? N/A Was EF assessed during THIS hospitalization? Yes by Cath  Was Cardiac Rehab II ordered? (Included Medically managed Patients): Yes   Outstanding Labs/Studies   Consider OP f/u labs 6-8 weeks given statin initiation this admission.  Duration of Discharge Encounter   Greater than 30 minutes including physician time.  Signed, Bhagat,Bhavinkumar PA-C 08/06/2016, 9:18 AM  Agree with note by Chelsea AusVin Bhagat PA-C  Stable for discharge today on dual antiplatelet therapy including aspirin and Brilinta. The patient's troponin was mildly elevated at 0.83. He was complaining of some shortness of breath probably related to Brilinta which we can reevaluate at the time of outpatient follow-up.  Runell GessJonathan J. Amaryah Mallen, M.D., FACP, [facility] FeFACC, Earl LagosFAHA, [facility] PaFSCAI [facility] [address] [place], [place]  [id number]  [phone] 08/06/2016 11:15 AM

## 2016-08-06 NOTE — Discharge Summary (Signed)
Please see 08/06/16 consult note for discharge summary.   Janiqua Friscia, PA-C

## 2016-08-11 LAB — HIV ANTIBODY (ROUTINE TESTING W REFLEX): HIV Screen 4th Generation wRfx: NONREACTIVE

## 2016-08-21 ENCOUNTER — Ambulatory Visit (INDEPENDENT_AMBULATORY_CARE_PROVIDER_SITE_OTHER): Payer: BLUE CROSS/BLUE SHIELD | Admitting: Cardiology

## 2016-08-21 ENCOUNTER — Encounter: Payer: Self-pay | Admitting: Cardiology

## 2016-08-21 VITALS — BP 134/78 | HR 64 | Ht 70.0 in | Wt 229.6 lb

## 2016-08-21 DIAGNOSIS — I214 Non-ST elevation (NSTEMI) myocardial infarction: Secondary | ICD-10-CM | POA: Diagnosis not present

## 2016-08-21 DIAGNOSIS — Z8249 Family history of ischemic heart disease and other diseases of the circulatory system: Secondary | ICD-10-CM | POA: Diagnosis not present

## 2016-08-21 DIAGNOSIS — Z79899 Other long term (current) drug therapy: Secondary | ICD-10-CM | POA: Diagnosis not present

## 2016-08-21 DIAGNOSIS — E785 Hyperlipidemia, unspecified: Secondary | ICD-10-CM | POA: Diagnosis not present

## 2016-08-21 DIAGNOSIS — Z9861 Coronary angioplasty status: Secondary | ICD-10-CM

## 2016-08-21 DIAGNOSIS — I2510 Atherosclerotic heart disease of native coronary artery without angina pectoris: Secondary | ICD-10-CM | POA: Diagnosis not present

## 2016-08-21 HISTORY — DX: Hyperlipidemia, unspecified: E78.5

## 2016-08-21 HISTORY — DX: Family history of ischemic heart disease and other diseases of the circulatory system: Z82.49

## 2016-08-21 NOTE — Assessment & Plan Note (Signed)
RCA PCI with DES 08/06/16

## 2016-08-21 NOTE — Patient Instructions (Signed)
Medication Instructions:  Your physician recommends that you continue on your current medications as directed. Please refer to the Current Medication list given to you today. If you need a refill on your cardiac medications before your next appointment, please call your pharmacy.  Labwork: CMP AND FASTING LIPID PANEL 1 WEEK BEFORE NEXT VISIT HERE IN OUR OFFICE AT [facility]  Follow-Up: Your physician wants you to follow-up in: 2 MONTHS WITH DR Allyson SabalBERRY.   [facility] you [facility] choosing [facility]!!

## 2016-08-21 NOTE — Assessment & Plan Note (Signed)
Reportedly LDL 128 at PCP's office. Pt discharged on high dose statin Rx

## 2016-08-21 NOTE — Progress Notes (Signed)
08/21/2016 Keith Hunter   10/05/1960  [id number]  Primary Physician Gordan PaymentGrisso, Greg A., MD Primary Cardiologist: Dr Allyson SabalBerry  HPI:  56 y/o male with no prior history of CAD, presented 08/05/16 with chest pain and ruled in for a NSTEMI with a Troponin peak of 0.083. Cath done 08/06/16 showed a high grade proximal/mid RCA stenosis. This was treated with PCI and DES. His LVF was preserved and he had no other significant CAD at cath. He is in the office today for follow up. He has done well since discharge, he is back to work and walking 2 miles a day. He was not discharged on a beta blocker secondary to baseline bradycardia. He has some knee pain and wondered if that was from Lipitor- it does not sound like it.    Current Outpatient Prescriptions  Medication Sig Dispense Refill  . aspirin EC 81 MG EC tablet Take 1 tablet (81 mg total) by mouth daily.    Marland Kitchen. atorvastatin (LIPITOR) 80 MG tablet Take 1 tablet (80 mg total) by mouth daily at 6 PM. 30 tablet 11  . Flaxseed, Linseed, (FLAXSEED OIL PO) Take 1 capsule by mouth daily.    Marland Kitchen. ibuprofen (ADVIL,MOTRIN) 200 MG tablet Take 200 mg by mouth every 6 (six) hours as needed for moderate pain.    . nitroGLYCERIN (NITROSTAT) 0.4 MG SL tablet Place 1 tablet (0.4 mg total) under the tongue every 5 (five) minutes x 3 doses as needed for chest pain. 25 tablet 12  . Omega-3 Fatty Acids (FISH OIL PO) Take 1 capsule by mouth daily.    . ticagrelor (BRILINTA) 90 MG TABS tablet Take 1 tablet (90 mg total) by mouth 2 (two) times daily. 60 tablet 11   No current facility-administered medications for this visit.     No Known Allergies  Past Medical History:  Diagnosis Date  . CAD (coronary artery disease), native coronary artery    a. Cath 08/05/16 - 99% pRCA & 50%mRCA s/p PTCA and  overlapping synergy drug-eluting stents  . Chest pain in adult 08/04/2016  . Mixed hyperlipidemia 04/24/2015   Last Assessment & Plan:  Relevant Hx: Course: Daily Update: Today's  Plan:this is going to be updated today and will see how he is with this  Electronically signed by: Krystal ClarkMelissa Joyce Brown-Patram, NP 04/25/15 1119  . NSTEMI (non-ST elevated myocardial infarction) (HCC) 08/05/2016  . Primary osteoarthritis involving multiple joints 04/25/2015   Last Assessment & Plan:  Relevant Hx: Course: Daily Update: Today's Plan:discussed this in depth with him and he is using some OTC meds for this when he golfs and he has tried glucosamine which did not seem to help him much at all  Electronically signed by: Krystal ClarkMelissa Joyce Brown-Patram, NP 04/25/15 1118  . Snoring 04/25/2015   Last Assessment & Plan:  Relevant Hx: Course: Daily Update: Today's Plan:long discussion with him about this and he is advised that he should have sleep study which he does not want to and is going to investigate having mouth piece  Electronically signed by: Krystal ClarkMelissa Joyce Brown-Patram, NP 04/25/15 1122    Social History   Social History  . Marital status: Married    Spouse name: N/A  . Number of children: N/A  . Years of education: N/A   Occupational History  . Not on file.   Social History Main Topics  . Smoking status: Never Smoker  . Smokeless tobacco: Never Used  . Alcohol use No  . Drug use: No  .  Sexual activity: Yes   Other Topics Concern  . Not on file   Social History Narrative  . No narrative on file     Family History  Problem Relation Age of Onset  . Heart disease Father   . Heart disease Brother      Review of Systems: General: negative for chills, fever, night sweats or weight changes.  Cardiovascular: negative for chest pain, dyspnea on exertion, edema, orthopnea, palpitations, paroxysmal nocturnal dyspnea or shortness of breath Dermatological: negative for rash Respiratory: negative for cough or wheezing Urologic: negative for hematuria Abdominal: negative for nausea, vomiting, diarrhea, bright red blood per rectum, melena, or hematemesis Neurologic: negative for  visual changes, syncope, or dizziness All other systems reviewed and are otherwise negative except as noted above.    Blood pressure 134/78, pulse 64, height 5\' 10"  (1.778 m), weight 229 lb 9.6 oz (104.1 kg).  General appearance: alert, cooperative and no distress Neck: no carotid bruit and no JVD Lungs: clear to auscultation bilaterally Heart: regular rate and rhythm Abdomen: soft, non-tender; bowel sounds normal; no masses,  no organomegaly Extremities: some ecchymosis Rt raidial site and forarm.  Pulses: 2+ and symmetric Skin: Skin color, texture, turgor normal. No rashes or lesions Neurologic: Grossly normal  EKG NSR- Q in 3 and small Q in AVF  ASSESSMENT AND PLAN:   Non-ST elevation (NSTEMI) myocardial infarction [facility](HCC) Pt admitted 08/05/16 with NSTEMI-peak Troponin 0.083  CAD S/P percutaneous coronary angioplasty RCA PCI with DES 08/06/16  Family history of coronary artery disease Both father and brother with documented CAD  Dyslipidemia Reportedly LDL 128 at PCP's office. Pt discharged on high dose statin Rx   PLAN  Same Rx. I asked him to record his B/P at home over the next several weeks. If his systolic consistently runs close to 135 we will need to add an ACE or ARB for HTN. We can follow this up when he returns. Check lipids and CMET before next OV. I suggested he continue Lipitor for now at the current dose.  I did provide him with samples of Brilinta as he hasn't reached his deductible yet and it would cost him 400 dollars.   Corine ShelterLuke Marsha Hillman PA-C 08/21/2016 3:12 PM

## 2016-08-21 NOTE — Assessment & Plan Note (Signed)
Both father and brother with documented CAD

## 2016-08-21 NOTE — Assessment & Plan Note (Signed)
Pt admitted 08/05/16 with NSTEMI-peak Troponin 0.083

## 2016-08-31 ENCOUNTER — Telehealth: Payer: Self-pay | Admitting: *Deleted

## 2016-08-31 DIAGNOSIS — Z9861 Coronary angioplasty status: Secondary | ICD-10-CM

## 2016-08-31 DIAGNOSIS — I214 Non-ST elevation (NSTEMI) myocardial infarction: Secondary | ICD-10-CM

## 2016-08-31 DIAGNOSIS — E785 Hyperlipidemia, unspecified: Secondary | ICD-10-CM

## 2016-08-31 DIAGNOSIS — Z79899 Other long term (current) drug therapy: Secondary | ICD-10-CM

## 2016-08-31 DIAGNOSIS — I2510 Atherosclerotic heart disease of native coronary artery without angina pectoris: Secondary | ICD-10-CM

## 2016-08-31 NOTE — Telephone Encounter (Signed)
Patient calling to see if there is an alternative to his cholesterol medication.  His Lipitor is making his legs and joints hurt.  Would like to know is there something else that will hurt less.  Please Advise.

## 2016-09-01 NOTE — Telephone Encounter (Signed)
Follow Up   Pt states no one ever called him. He would like a return phone call.

## 2016-09-01 NOTE — Telephone Encounter (Signed)
Discontinue Lipitor, begin pravastatin 40 mg a day as well as Co Q 10 200 mg/d. Recheck lipid and liver profile in 2 months. If [facility] continues to have side effects [facility] .

## 2016-09-02 MED ORDER — PRAVASTATIN SODIUM 40 MG PO TABS: 40.0000 mg | ORAL_TABLET | Freq: Every evening | ORAL | 3 refills | Status: DC

## 2016-09-02 MED ORDER — COQ10 200 MG PO CAPS: 200.0000 mg | ORAL_CAPSULE | Freq: Every day | ORAL | Status: AC

## 2016-09-03 ENCOUNTER — Other Ambulatory Visit: Payer: Self-pay | Admitting: Cardiovascular Disease

## 2016-09-03 DIAGNOSIS — E785 Hyperlipidemia, unspecified: Secondary | ICD-10-CM

## 2016-09-03 NOTE — Telephone Encounter (Signed)
Repeat labs ordered along with new Rx's. Attempted to call pt but there was no answer and could not leave vm---automated message said he is out of range.   Looks like michelle, LPN called pt, but i do not see note. Routing to michelle to clarify.

## 2016-09-04 NOTE — Telephone Encounter (Signed)
Pt notified of medication/lab and verbalizes understanding

## 2016-09-28 ENCOUNTER — Other Ambulatory Visit: Payer: Self-pay | Admitting: Physician Assistant

## 2016-10-16 LAB — LIPID PANEL
CHOL/HDL RATIO: 3.8 ratio (ref 0.0–5.0)
Cholesterol, Total: 155 mg/dL (ref 100–199)
HDL: 41 mg/dL (ref >39)
LDL CALC: 88 mg/dL (ref 0–99)
Triglycerides: 132 mg/dL (ref 0–149)
VLDL Cholesterol Cal: 26 mg/dL (ref 5–40)

## 2016-10-16 LAB — HEPATIC FUNCTION PANEL
ALT: 55 IU/L — AB (ref 0–44)
AST: 39 IU/L (ref 0–40)
Albumin: 4.3 g/dL (ref 3.5–5.5)
Alkaline Phosphatase: 77 IU/L (ref 39–117)
BILIRUBIN, DIRECT: 0.22 mg/dL (ref 0.00–0.40)
Bilirubin Total: 1 mg/dL (ref 0.0–1.2)
TOTAL PROTEIN: 6.4 g/dL (ref 6.0–8.5)

## 2016-10-23 ENCOUNTER — Ambulatory Visit (INDEPENDENT_AMBULATORY_CARE_PROVIDER_SITE_OTHER): Payer: BLUE CROSS/BLUE SHIELD | Admitting: Cardiovascular Disease

## 2016-10-23 ENCOUNTER — Encounter: Payer: Self-pay | Admitting: Cardiovascular Disease

## 2016-10-23 VITALS — BP 144/96 | HR 71 | Ht 70.0 in | Wt 229.6 lb

## 2016-10-23 DIAGNOSIS — R0683 Snoring: Secondary | ICD-10-CM | POA: Diagnosis not present

## 2016-10-23 DIAGNOSIS — R400 Somnolence: Secondary | ICD-10-CM | POA: Diagnosis not present

## 2016-10-23 DIAGNOSIS — I10 Essential (primary) hypertension: Secondary | ICD-10-CM

## 2016-10-23 DIAGNOSIS — E785 Hyperlipidemia, unspecified: Secondary | ICD-10-CM

## 2016-10-23 DIAGNOSIS — Z9861 Coronary angioplasty status: Secondary | ICD-10-CM

## 2016-10-23 DIAGNOSIS — I2510 Atherosclerotic heart disease of native coronary artery without angina pectoris: Secondary | ICD-10-CM

## 2016-10-23 HISTORY — DX: Essential (primary) hypertension: I10

## 2016-10-23 NOTE — Progress Notes (Signed)
10/23/2016 Keith Hunter   14-Mar-1960  [id number]  Primary Physician Keith Hunter., MD Primary Cardiologist: Keith Gess MD Keith Hunter, [place]  HPI:  Keith Hunter is a 56 y.o. male Keith Hunter Caucasian male with no children is accompanied by his wife today. 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 He basically has no cardiac risk factors other than family history the father has had 8 stents beginning in his 63s and a brother less dense as well. He had a non-STEMI 08/05/16. I performed radial cath on him and placed 2 overlapping drug-eluting stents in its proximal dominant RCA. The remainder of his coronary anatomy was free of significant disease. Normal LV function. He denies chest pain or shortness of breath. He remains on dual antiplatelet therapy.   Current Meds  Medication Sig  . aspirin EC 81 MG EC tablet Take 1 tablet (81 mg total) by mouth daily.  Marland Kitchen BRILINTA 90 MG TABS tablet TAKE 1 TABLET BY MOUTH TWICE A DAY  . Coenzyme Q10 (COQ10) 200 MG CAPS Take 200 mg by mouth daily.  . Flaxseed, Linseed, (FLAXSEED OIL PO) Take 1 capsule by mouth daily.  . nitroGLYCERIN (NITROSTAT) 0.4 MG SL tablet Place 1 tablet (0.4 mg total) under the tongue every 5 (five) minutes x 3 doses as needed for chest pain.  . Omega-3 Fatty Acids (FISH OIL PO) Take 1 capsule by mouth daily.  . pravastatin (PRAVACHOL) 40 MG tablet Take 1 tablet (40 mg total) by mouth every evening.     No Known Allergies  Social History   Social History  . Marital status: Married    Spouse name: N/A  . Number of children: N/A  . Years of education: N/A   Occupational History  . Not on file.   Social History Main Topics  . Smoking status: Never Smoker  . Smokeless tobacco: Never Used  . Alcohol use No  . Drug use: No  . Sexual activity: Yes   Other Topics Concern  . Not on file   Social History Narrative  . No narrative on file     Review of Systems: General:  negative for chills, fever, night sweats or weight changes.  Cardiovascular: negative for chest pain, dyspnea on exertion, edema, orthopnea, palpitations, paroxysmal nocturnal dyspnea or shortness of breath Dermatological: negative for rash Respiratory: negative for cough or wheezing Urologic: negative for hematuria Abdominal: negative for nausea, vomiting, diarrhea, bright red blood per rectum, melena, or hematemesis Neurologic: negative for visual changes, syncope, or dizziness All other systems reviewed and are otherwise negative except as noted above.    Blood pressure (!) 144/96, pulse 71, height  (1.778 m), weight 229 lb 9.6 oz (104.1 kg).  General appearance: alert and no distress Neck: no adenopathy, no carotid bruit, no JVD, supple, symmetrical, trachea midline and thyroid not enlarged, symmetric, no tenderness/mass/nodules Lungs: clear to auscultation bilaterally Heart: regular rate and rhythm, S1, S2 normal, no murmur, click, rub or gallop Extremities: extremities normal, atraumatic, no cyanosis or edema Pulses: 2+ and symmetric Skin: Skin color, texture, turgor normal. No rashes or lesions Neurologic: Alert and oriented X 3, normal strength and tone. Normal symmetric reflexes. Normal coordination and gait  EKG Not performed today  ASSESSMENT AND PLAN:   Snoring Keith Hunter has history of snoring, daytime somnolence and elevated blood pressure. I'm going to obtain an outpatient sleep study to further evaluate for obstructive sleep apnea.  CAD S/P percutaneous coronary angioplasty History of non-STEMI with coronary catheterization performed by myself radially 08/05/16 revealing high-grade proximal ulcerated plaque and a dominant RCA which I stented using overlapping drug-eluting stents. His EF was normal. He remains on dual antiplatelet therapy including low-dose aspirin and Brilenta. He had no recurrent symptoms.  Dyslipidemia History of dyslipidemia on pravastatin 40  mg a day with recent lipid profile revealing a LDL of 88. He does admit to dietary indiscretion talked about the importance of a heart healthy diet. We will recheck a lipid liver profile in 3 months.  Essential hypertension Borderline elevated blood pressures with blood pressures a rate reading 144/96. I reviewed his blood pressure log and he has tended to have elevated blood pressures. Does admit to dietary indiscretion with regard to salt. We agreed to keep a blood pressure log for the next 30 days and he will see Keith Hunter in the office for review this and the and decision with regards to whether to start antihypertensive medications.      Keith Gess MD FACP,FACC,FAHA, [facility] 10/23/2016 4:34 PM

## 2016-10-23 NOTE — Assessment & Plan Note (Signed)
History of dyslipidemia on pravastatin 40 mg a day with recent lipid profile revealing a LDL of 88. He does admit to dietary indiscretion talked about the importance of a heart healthy diet. We will recheck a lipid liver profile in 3 months.

## 2016-10-23 NOTE — Patient Instructions (Signed)
Medication Instructions: Your physician recommends that you continue on your current medications as directed. Please refer to the Current Medication list given to you today.  Labwork: Your physician recommends that you return for a FASTING lipid profile and hepatic function panel in 3 months.   Testing/Procedures: Your physician has recommended that you have a sleep study. This test records several body functions during sleep, including: brain activity, eye movement, oxygen and carbon dioxide blood levels, heart rate and rhythm, breathing rate and rhythm, the flow of air through your mouth and nose, snoring, body muscle movements, and chest and belly movement.  Follow-Up: Your physician recommends that you schedule a follow-up appointment in: 1 month with PharmD for BP management. Your physician has requested that you regularly monitor and record your blood pressure readings at home. Please use the same machine at the same time of day to check your readings and record them to bring to your follow-up visit.  We request that you follow-up in: 6 months with Corine Shelter, PA and in 12 months with Dr San Morelle will receive a reminder letter in the mail two months in advance. If you don't receive a letter, please call our office to schedule the follow-up appointment.  If you need a refill on your cardiac medications before your next appointment, please call your pharmacy.

## 2016-10-23 NOTE — Assessment & Plan Note (Signed)
History of non-STEMI with coronary catheterization performed by myself radially 08/05/16 revealing high-grade proximal ulcerated plaque and a dominant RCA which I stented using overlapping drug-eluting stents. His EF was normal. He remains on dual antiplatelet therapy including low-dose aspirin and Brilenta. He had no recurrent symptoms.

## 2016-10-23 NOTE — Assessment & Plan Note (Signed)
Borderline elevated blood pressures with blood pressures a rate reading 144/96. I reviewed his blood pressure log and he has tended to have elevated blood pressures. Does admit to dietary indiscretion with regard to salt. We agreed to keep a blood pressure log for the next 30 days and he will see Belenda Cruise in the office for review this and the and decision with regards to whether to start antihypertensive medications.

## 2016-10-23 NOTE — Assessment & Plan Note (Signed)
Keith Hunter has history of snoring, daytime somnolence and elevated blood pressure. I'm going to obtain an outpatient sleep study to further evaluate for obstructive sleep apnea.

## 2016-10-28 ENCOUNTER — Telehealth: Payer: Self-pay | Admitting: *Deleted

## 2016-10-28 ENCOUNTER — Other Ambulatory Visit: Payer: Self-pay | Admitting: *Deleted

## 2016-10-28 DIAGNOSIS — Z9861 Coronary angioplasty status: Secondary | ICD-10-CM

## 2016-10-28 DIAGNOSIS — R0683 Snoring: Secondary | ICD-10-CM

## 2016-10-28 DIAGNOSIS — R400 Somnolence: Secondary | ICD-10-CM

## 2016-10-28 DIAGNOSIS — I2510 Atherosclerotic heart disease of native coronary artery without angina pectoris: Secondary | ICD-10-CM

## 2016-10-28 DIAGNOSIS — I10 Essential (primary) hypertension: Secondary | ICD-10-CM

## 2016-10-28 NOTE — Telephone Encounter (Signed)
Home sleep study ordered and scheduled for 11/2 at 11:45.  Approved per Precert.  Will notify patient

## 2016-10-28 NOTE — Telephone Encounter (Signed)
-----   Message from Haywood Filler sent at 10/28/2016  1:58 PM EDT ----- Regarding: FW: In lab sleep study denied Chloeanne Poteet, This patient needs to be switched to a home study. 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 Would you be the one to schedule the home study? Thanks, Amy ----- Message ----- From: Runell Gess, MD Sent: 10/28/2016   1:40 PM To: Amy Pamella Pert Subject: RE: In lab sleep study denied                  Yes ----- Message ----- From: Haywood Filler Sent: 10/28/2016  12:40 PM To: Runell Gess, MD, Evans Lance Subject: In lab sleep study denied                      Patient did not meet criteria for an in lab sleep study. Is it okay to switch patient to a home sleep study? Thanks, Amy

## 2016-10-30 NOTE — Telephone Encounter (Signed)
Spoke to patient, aware that in lab study has been denied by insurance and changed to a home sleep study.   Patient request to see if this can be moved up into October due to insurance change.     Home sleep study rescheduled for 10/26 at 2:15  Left detailed message on VM (Per patient request) with appointment time and date as well as # for sleep center.  Advised to call with further questions or concerns.

## 2016-11-11 ENCOUNTER — Encounter (HOSPITAL_BASED_OUTPATIENT_CLINIC_OR_DEPARTMENT_OTHER): Payer: BLUE CROSS/BLUE SHIELD

## 2016-11-13 ENCOUNTER — Ambulatory Visit (HOSPITAL_BASED_OUTPATIENT_CLINIC_OR_DEPARTMENT_OTHER): Payer: BLUE CROSS/BLUE SHIELD | Attending: Cardiovascular Disease | Admitting: Cardiovascular Disease

## 2016-11-13 VITALS — Ht 70.0 in | Wt 229.0 lb

## 2016-11-13 DIAGNOSIS — I2510 Atherosclerotic heart disease of native coronary artery without angina pectoris: Secondary | ICD-10-CM | POA: Insufficient documentation

## 2016-11-13 DIAGNOSIS — I10 Essential (primary) hypertension: Secondary | ICD-10-CM | POA: Insufficient documentation

## 2016-11-13 DIAGNOSIS — G4710 Hypersomnia, unspecified: Secondary | ICD-10-CM | POA: Insufficient documentation

## 2016-11-13 DIAGNOSIS — G4733 Obstructive sleep apnea (adult) (pediatric): Secondary | ICD-10-CM | POA: Insufficient documentation

## 2016-11-13 DIAGNOSIS — R0683 Snoring: Secondary | ICD-10-CM | POA: Insufficient documentation

## 2016-11-20 ENCOUNTER — Encounter (HOSPITAL_BASED_OUTPATIENT_CLINIC_OR_DEPARTMENT_OTHER): Payer: BLUE CROSS/BLUE SHIELD

## 2016-11-23 ENCOUNTER — Telehealth: Payer: Self-pay | Admitting: Cardiovascular Disease

## 2016-11-23 NOTE — Telephone Encounter (Signed)
Keith Hunter is calling to see if he can get his Cholesterol medication changed(Prevastatin )  .  It is making him hurt. Please call   Thanks

## 2016-11-23 NOTE — Telephone Encounter (Signed)
Returned call to patient, patient states he has continued to have severe side effects from his cholesterol medication.  States he was taking lipitor, then was switched to pravastatin.    States he "can hardly get out of the bed in the morning" due to joint aches and pains.   States he stopped this medication Saturday and is already feeling better.   Advised I would route to Dr. Allyson SabalBerry and pharmacist for review and further recommendations.    Per previous telephone note: Keith Hunter, Keith Hunter, Keith Hunter  to Keith Hunter, Keith Hunter   11:19 AM  Note    Discontinue Lipitor, begin pravastatin 40 mg a day as well as Co Q 10 200 mg/d. Recheck lipid and liver profile in 2 months. If he continues to have side effects [facility] .       Patient is aware and verbalized understanding.

## 2016-11-24 ENCOUNTER — Other Ambulatory Visit (HOSPITAL_BASED_OUTPATIENT_CLINIC_OR_DEPARTMENT_OTHER): Payer: Self-pay

## 2016-11-24 DIAGNOSIS — R0683 Snoring: Secondary | ICD-10-CM

## 2016-11-24 DIAGNOSIS — I10 Essential (primary) hypertension: Secondary | ICD-10-CM

## 2016-11-24 DIAGNOSIS — I2510 Atherosclerotic heart disease of native coronary artery without angina pectoris: Secondary | ICD-10-CM

## 2016-11-24 DIAGNOSIS — R400 Somnolence: Secondary | ICD-10-CM

## 2016-11-24 DIAGNOSIS — Z9861 Coronary angioplasty status: Principal | ICD-10-CM

## 2016-11-25 ENCOUNTER — Telehealth: Payer: Self-pay | Admitting: Cardiovascular Disease

## 2016-11-25 DIAGNOSIS — G4733 Obstructive sleep apnea (adult) (pediatric): Secondary | ICD-10-CM

## 2016-11-25 NOTE — Telephone Encounter (Signed)
New message    Patient calling sleep study results. Please call

## 2016-11-25 NOTE — Telephone Encounter (Signed)
Returned call to patient he stated he had home sleep study 11/13/16 and he was calling to get results.Advised results not available.I will send message to Dr.Kelly.

## 2016-11-26 NOTE — Telephone Encounter (Signed)
Ave patient follow-up with [facility] to discuss PCSK9 monoclonal

## 2016-11-26 NOTE — Telephone Encounter (Signed)
Message sent to scheduling to arrange this appointment. 

## 2016-11-27 ENCOUNTER — Ambulatory Visit: Payer: BLUE CROSS/BLUE SHIELD

## 2016-11-28 ENCOUNTER — Encounter (HOSPITAL_BASED_OUTPATIENT_CLINIC_OR_DEPARTMENT_OTHER): Payer: Self-pay | Admitting: Cardiovascular Disease

## 2016-11-28 NOTE — Procedures (Signed)
     Patient Name: Keith Hunter, [name] Study Date: 11/14/2016 Gender: Male D.O.B: 10/29/1960 Age (years): 56 Referring Provider: Runell GessJonathan J Berry Height (inches): 70 Interpreting Physician: Nicki Guadalajarahomas Talayia Hjort MD, ABSM Weight (lbs): 229 R[place]GT: [place] BMI: 33 MRN: [id number] Neck Size: 18.00  CLINICAL INFORMATION Sleep Study Type: Home Sleep Test  Indication for sleep study: Hypertension, Snoring  Epworth Sleepiness Score: 3  SLEEP STUDY TECHNIQUE A multi-channel overnight portable sleep study was performed. The channels recorded were: nasal airflow, thoracic respiratory movement, and oxygen saturation with a pulse oximetry. Snoring was also monitored.  MEDICATIONS *    aspirin EC 81 MG EC tablet    Take 1 tablet (81 mg total) by mouth daily. *    BRILINTA 90 MG TABS tablet    TAKE 1 TABLET BY MOUTH TWICE A DAY *    Coenzyme Q10 (COQ10) 200 MG CAPS    Take 200 mg by mouth daily. *    Flaxseed, Linseed, (FLAXSEED OIL PO)    Take 1 capsule by mouth daily. *    nitroGLYCERIN (NITROSTAT) 0.4 MG SL tablet    Place 1 tablet (0.4 mg total) under the tongue every 5 (five) minutes x 3 doses as needed for chest pain. *    Omega-3 Fatty Acids (FISH OIL PO)    Take 1 capsule by mouth daily. *    pravastatin (PRAVACHOL) 40 MG tablet    Take 1 tablet (40 mg total) by mouth every evening.  Patient self administered medications include: N/A.  SLEEP ARCHITECTURE Patient was studied for 392.0 minutes. The sleep efficiency was 100.0 % and the patient was supine for 69.9%. The arousal index was 0.0 per hour.  RESPIRATORY PARAMETERS The overall AHI was 46.4 per hour, with a central apnea index of 0.0 per hour.  The oxygen nadir was 86% during sleep.  CARDIAC DATA Mean heart rate during sleep was 64.6 bpm.  IMPRESSIONS - Severe obstructive sleep apnea  with an AHI 46.4/h. - No significant central sleep apnea occurred during this study (CAI = 0.0/h). - Moderate oxygen desaturation to a  nadir of 86%. .  The patient spent 3.4 minutes with saturation less than 89%. - Patient snored 44.7% during the sleep. - Excellent sleep efficiency.  DIAGNOSIS - Obstructive Sleep Apnea (327.23 [G47.33 ICD-10])  RECOMMENDATIONS - CPAP titration is recommended for optimal treatment of this patient's severe obstructive sleep apnea. - Efforts should be made to optimize nasal and oral pharyngeal patency - Avoid alcohol, sedatives and other CNS depressants that may worsen sleep apnea and disrupt normal sleep architecture. - Sleep hygiene should be reviewed to assess factors that may improve sleep quality. - Weight management and regular exercise should be initiated or continued. - An in lab CPAP titration is recommended in this patient with cardiovascular comorbidities including prior myocardial infarction  [Electronically signed] 11/28/2016 06:19 PM  Nicki Guadalajarahomas Shulamit Donofrio MD, [facility] Centennial[facility]ACC, ABSM Diplomate, American Board of Sleep Medicine   NPI: [id number] [facility] PH: [phone]   FX: [phone]) [phone] ACCREDITED BY THE AMERICAN ACADEMY OF SLEEP MEDICINE

## 2016-11-30 NOTE — Telephone Encounter (Signed)
The patient's home sleep study was read.  Please see note.  CPAP titration was recommended.

## 2016-11-30 NOTE — Telephone Encounter (Signed)
Pt scheduled with PharmD on 11/20

## 2016-12-02 NOTE — Telephone Encounter (Signed)
Patient aware and verbalized understanding.   CPAP titration ordered, message sent to pre cert.  Patient aware someone will call to schedule once approved by insurance.

## 2016-12-02 NOTE — Telephone Encounter (Signed)
-----   Message from Lennette Biharihomas A Kelly, MD sent at 11/28/2016  6:25 PM EST ----- Rashawd Laskaris, please notify the patient of his severe sleep apnea.  Recommend in lab CPAP titration study.

## 2016-12-02 NOTE — Progress Notes (Signed)
See telephone note 11/14 

## 2016-12-21 ENCOUNTER — Telehealth: Payer: Self-pay | Admitting: Cardiovascular Disease

## 2016-12-21 ENCOUNTER — Telehealth: Payer: Self-pay | Admitting: *Deleted

## 2016-12-21 NOTE — Telephone Encounter (Signed)
Opened in error

## 2016-12-21 NOTE — Telephone Encounter (Signed)
Patient wife (beth)calling, states that patient is not taking any cholesterol medication and needs a prescription.   Please see note from previous encounter in epic.   Returned the call to the patient. He stated that he is currently not taking any cholesterol medication and does not want to take Repatha. He recently cancelled his appointment with pharmd. He stated that he will start having his PCP check his cholesterol from now on and will update Dr. Allyson SabalBerry at his next appointment.

## 2016-12-21 NOTE — Telephone Encounter (Signed)
Patient wife (beth)calling, states that patient is not taking any cholesterol medication and needs a prescription.

## 2016-12-21 NOTE — Telephone Encounter (Signed)
Returned the call to the patient. The wife is not listed on the dpr. The patient wanted to get an update on the CPAP titration. He has been informed that it has been sent to pre cert. Will forward to the nurse for her knowledge that he called checking in on it.  

## 2016-12-21 NOTE — Telephone Encounter (Signed)
Patient wife (beth) calling, states that patient had sleep study but is waiting on next steps.

## 2016-12-21 NOTE — Telephone Encounter (Signed)
Returned the call to the patient. The wife is not listed on the dpr. The patient wanted to get an update on the CPAP titration. He has been informed that it has been sent to pre cert. Will forward to the nurse for her knowledge that he called checking in on it.

## 2016-12-23 NOTE — Telephone Encounter (Signed)
Per precert-this has been sent for review for in lab titration study.

## 2016-12-24 ENCOUNTER — Other Ambulatory Visit: Payer: Self-pay | Admitting: *Deleted

## 2016-12-24 DIAGNOSIS — I2510 Atherosclerotic heart disease of native coronary artery without angina pectoris: Secondary | ICD-10-CM

## 2016-12-24 DIAGNOSIS — I10 Essential (primary) hypertension: Secondary | ICD-10-CM

## 2016-12-24 DIAGNOSIS — Z9861 Coronary angioplasty status: Secondary | ICD-10-CM

## 2016-12-24 DIAGNOSIS — G4733 Obstructive sleep apnea (adult) (pediatric): Secondary | ICD-10-CM

## 2016-12-30 NOTE — Telephone Encounter (Signed)
Per precert-insurance has denied in lab titration study.     Spoke to patient.  Advised I would send orders to [facility] for home CPAP auto titration.   Advised they would contact him to get set up.  Patient verbalized understanding.    Orders faxed to [facility].

## 2017-04-14 ENCOUNTER — Telehealth: Payer: Self-pay | Admitting: Cardiovascular Disease

## 2017-04-14 NOTE — Telephone Encounter (Signed)
°  New message  Pt verbalized that he is calling for RN  He has an appt with Dr.Kelly 04/13/2017 pt verbalized that he has never seen Dr.Kelly He wants to know why he is coming into the appt, what will be done and if he has to bring his breathing machine into the office and why  Please call pt and advise

## 2017-04-14 NOTE — Telephone Encounter (Signed)
Returned a call to patient. Answered all questions concerning tomorrow's appointment to patient's satisfaction.

## 2017-04-14 NOTE — Telephone Encounter (Signed)
Returned a call to patient. All questions addressed satisfactorily.

## 2017-04-15 ENCOUNTER — Ambulatory Visit (INDEPENDENT_AMBULATORY_CARE_PROVIDER_SITE_OTHER): Payer: BLUE CROSS/BLUE SHIELD | Admitting: Cardiovascular Disease

## 2017-04-15 VITALS — BP 110/74 | HR 66 | Ht 70.0 in | Wt 234.0 lb

## 2017-04-15 DIAGNOSIS — Z9861 Coronary angioplasty status: Secondary | ICD-10-CM

## 2017-04-15 DIAGNOSIS — I2510 Atherosclerotic heart disease of native coronary artery without angina pectoris: Secondary | ICD-10-CM | POA: Diagnosis not present

## 2017-04-15 DIAGNOSIS — E785 Hyperlipidemia, unspecified: Secondary | ICD-10-CM

## 2017-04-15 DIAGNOSIS — Z79899 Other long term (current) drug therapy: Secondary | ICD-10-CM | POA: Diagnosis not present

## 2017-04-15 DIAGNOSIS — I214 Non-ST elevation (NSTEMI) myocardial infarction: Secondary | ICD-10-CM

## 2017-04-15 DIAGNOSIS — G4733 Obstructive sleep apnea (adult) (pediatric): Secondary | ICD-10-CM

## 2017-04-15 MED ORDER — EZETIMIBE 10 MG PO TABS: 10.0000 mg | ORAL_TABLET | Freq: Every day | ORAL | 3 refills | Status: DC

## 2017-04-15 NOTE — Patient Instructions (Signed)
Medication Instructions:  START Zetia 10 mg daily  Labwork: Please return for FASTING labs in 3 months (CMET, Lipid)  Our in office lab hours are Monday-Friday 8:00-4:00, closed for lunch 12:45-1:45 pm.  No appointment needed.  Follow-Up: 3-4 months with Dr. Allyson SabalBerry  Your physician wants you to follow-up in: 12 months with Dr. Tresa EndoKelly (sleep clinic). You will receive a reminder letter in the mail two months in advance. If you don't receive a letter, please call our office to schedule the follow-up appointment.   Any Other Special Instructions Will Be Listed Below (If Applicable).     If you need a refill on your cardiac medications before your next appointment, please call your pharmacy.

## 2017-04-15 NOTE — Progress Notes (Signed)
Cardiology Office Note    Date:  04/17/2017   ID:  Keith Hunter, DOB 1960/11/14, MRN [id number]  PCP:  Raina Mina., MD  Cardiologist:  Shelva Majestic, MD (sleep): Dr. Adora Fridge  New sleep evaluation  History of Present Illness:  Keith Hunter is a 57 y.o. male who presents to sleep clinic following initiation of CPAP therapy for obstructive sleep apnea.    [name] has a strong family history of coronary artery disease in that his father had 7 stents placed beginning in his 30s and a brother who also had a stent. He suffered a non-ST segment elevation MI in July 2018 secondary to RCA occlusion and 2 stents were placed in the proximal dominant right coronary artery.  Due to concerns for sleep apnea, he was referred for a sleep study.  This initially was done as a home study since insurance denied the in- lab evaluation.  On the home study he was found to have severe sleep apnea with an AHI of 46.4.  Oxygen desaturated to a nadir of 86%.  He snored for 44.7% of sleep.  Insurance denied an in lab CPAP titration and as a result he has been on AutoPap therapy with this minimum pressure at 4 and maximum of 20.  [employer] is his DME company and AutoPap therapy initiated January 21, 2017.  He had been using a ResMed AirFit F 30 mask, and was switched to nasal pillows, but has noted some irritation to the nares.  In the office I obtained a download from February 25 - April 13, 2017.  Usage days is 100%.  Usage greater than 4 hours was 93%.  Average usage was 6 hours and 19 minutes.  AHI was still mildly elevated at 6.9.  There was no major leak.  95th percentile average pressure was 9.0 with a maximum average of 9.9.  Since initiating CPAP therapy he feels his sleep is improved but at times he still feels like he may not be getting enough pressure.  An Epworth Sleepiness Scale score was calculated in the office today this was excellent at 2, arguing against residual daytime sleepiness.  He has a  history of hyperlipidemia and since his myocardial infarction was tried on Crestor, Lipitor, and pravastatin.  He had stopped all of these  In September 2018 on statin therapy, LDL was 88.  He has not had this rechecked.  He continues to be on aspirin andrilinta for dual antiplatelet therapy.  He presents for evaluation.   Past Medical History:  Diagnosis Date  . CAD (coronary artery disease), native coronary artery    a. Cath 08/05/16 - 99% pRCA & 50%mRCA s/p PTCA and  overlapping synergy drug-eluting stents  . Chest pain in adult 08/04/2016  . Mixed hyperlipidemia 04/24/2015   Last Assessment & Plan:  Relevant Hx: Course: Daily Update: Today's Plan:this is going to be updated today and will see how he is with this  Electronically signed by: Mayer Camel, NP 04/25/15 1119  . NSTEMI (non-ST elevated myocardial infarction) ([place]) 08/05/2016  . Primary osteoarthritis involving multiple joints 04/25/2015   Last Assessment & Plan:  Relevant Hx: Course: Daily Update: Today's Plan:discussed this in depth with him and he is using some OTC meds for this when he golfs and he has tried glucosamine which did not seem to help him much at all  Electronically signed by: Mayer Camel, NP 04/25/15 1118  . Snoring 04/25/2015   Last Assessment & Plan:  Relevant Hx: Course: Daily Update: Today's Plan:long discussion with him about this and he is advised that he should have sleep study which he does not want to and is going to investigate having mouth piece  Electronically signed by: Mayer Camel, NP 04/25/15 1122    Past Surgical History:  Procedure Laterality Date  . CORONARY ANGIOPLASTY WITH STENT PLACEMENT  08/05/2016  . CORONARY STENT INTERVENTION N/A 08/05/2016   Procedure: Coronary Stent Intervention;  Surgeon: Lorretta Harp, MD;  Location: [facility];  Service: Cardiovascular;  Laterality: N/A;  . LEFT HEART CATH AND CORONARY ANGIOGRAPHY N/A 08/05/2016    Procedure: Left Heart Cath and Coronary Angiography;  Surgeon: Lorretta Harp, MD;  Location: [facility];  Service: Cardiovascular;  Laterality: N/A;  . SHOULDER OPEN ROTATOR CUFF REPAIR Right 1981    Current Medications: Outpatient Medications Prior to Visit  Medication Sig Dispense Refill  . aspirin EC 81 MG EC tablet Take 1 tablet (81 mg total) by mouth daily.    Marland Kitchen BRILINTA 90 MG TABS tablet TAKE 1 TABLET BY MOUTH TWICE A DAY 60 tablet 10  . Coenzyme Q10 (COQ10) 200 MG CAPS Take 200 mg by mouth daily.    . Flaxseed, Linseed, (FLAXSEED OIL PO) Take 1 capsule by mouth daily.    . nitroGLYCERIN (NITROSTAT) 0.4 MG SL tablet Place 1 tablet (0.4 mg total) under the tongue every 5 (five) minutes x 3 doses as needed for chest pain. 25 tablet 12  . Omega-3 Fatty Acids (FISH OIL PO) Take 1 capsule by mouth daily.    . pravastatin (PRAVACHOL) 40 MG tablet Take 1 tablet (40 mg total) by mouth every evening. 30 tablet 3   No facility-administered medications prior to visit.      Allergies:   Patient has no known allergies.   Social History   Socioeconomic History  . Marital status: Married    Spouse name: Not on file  . Number of children: Not on file  . Years of education: Not on file  . Highest education level: Not on file  Occupational History  . Not on file  Social Needs  . Financial resource strain: Not on file  . Food insecurity:    Worry: Not on file    Inability: Not on file  . Transportation needs:    Medical: Not on file    Non-medical: Not on file  Tobacco Use  . Smoking status: Never Smoker  . Smokeless tobacco: Never Used  Substance and Sexual Activity  . Alcohol use: No  . Drug use: No  . Sexual activity: Yes  Lifestyle  . Physical activity:    Days per week: Not on file    Minutes per session: Not on file  . Stress: Not on file  Relationships  . Social connections:    Talks on phone: Not on file    Gets together: Not on file    Attends religious  service: Not on file    Active member of club or organization: Not on file    Attends meetings of clubs or organizations: Not on file    Relationship status: Not on file  Other Topics Concern  . Not on file  Social History Narrative  . 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    Family History:  The patient's family history includes Heart disease in his brother  and father.   ROS General: Negative; No fevers, chills, or night sweats;  HEENT: Negative; No changes in vision or hearing, sinus congestion, difficulty swallowing Pulmonary: Negative; No cough, wheezing, shortness of breath, hemoptysis Cardiovascular: Negative; No chest pain, presyncope, syncope, palpitations GI: Negative; No nausea, vomiting, diarrhea, or abdominal pain GU: Negative; No dysuria, hematuria, or difficulty voiding Musculoskeletal: myalgias with statins Hematologic/Oncology: Negative; no easy bruising, bleeding Endocrine: Negative; no heat/cold intolerance; no diabetes Neuro: Negative; no changes in balance, headaches Skin: Negative; No rashes or skin lesions Psychiatric: Negative; No behavioral problems, depression Sleep:.  Positive for OSA, recently started on CPAP therapy.  Previous significant snoring, improved. No daytime sleepiness, hypersomnolence, bruxism, restless legs, hypnogognic hallucinations, no cataplexy Other comprehensive 14 point system review is negative.   PHYSICAL EXAM:   VS:  BP 110/74 (BP Location: Left Arm, Patient Position: Sitting, Cuff Size: Large)   Pulse 66   Ht _0  (1.778 m)   Wt 234 lb (106.1 kg)   BMI 33.58 kg/m     Repeat blood pressure by me 114/76 Wt Readings from Last 3 Encounters:  04/15/17 234 lb (106.1 kg)  11/13/16 229 lb (103.9 kg)  10/23/16 229 lb 9.6 oz (104.1 kg)    General: Alert, oriented, no distress. mildly obese Skin: normal turgor, no rashes, warm and dry HEENT:  Normocephalic, atraumatic. Pupils equal round and reactive to light; sclera anicteric; extraocular muscles intact; Fundi .  No hemorrhages or exudates.  Disks flat Nose without nasal septal hypertrophy Mouth/Parynx benign; Mallinpatti scale 3 Neck: Thick neck;No JVD, no carotid bruits; normal carotid upstroke Lungs: clear to ausculatation and percussion; no wheezing or rales Chest wall: without tenderness to palpitation Heart: PMI not displaced, RRR, s1 s2 normal, 1/6 systolic murmur, no diastolic murmur, no rubs, gallops, thrills, or heaves Abdomen: soft, nontender; no hepatosplenomehaly, BS+; abdominal aorta nontender and not dilated by palpation. Back: no CVA tenderness Pulses 2+ Musculoskeletal: full range of motion, normal strength, no joint deformities Extremities: no clubbing cyanosis or edema, Homan's sign negative  Neurologic: grossly nonfocal; Cranial nerves grossly wnl Psychologic: Normal mood and affect   Studies/Labs Reviewed:   EKG:  EKG is ordered today.  ECG (independently read by me): normal sinus rhythm at 70 bpm.  Q wave in 3 and aVF.  Poor anterior R-wave progression.  Normal intervals.  No ectopy.  Recent Labs: BMP Latest Ref Rng & Units 08/06/2016 08/05/2016  Glucose 65 - 99 mg/dL 104(H) 114(H)  BUN 6 - 20 mg/dL 15 20  Creatinine 0.61 - 1.24 mg/dL 1.10 1.12  Sodium 135 - 145 mmol/L 138 140  Potassium 3.5 - 5.1 mmol/L 4.2 4.4  Chloride 101 - 111 mmol/L 107 104  CO2 22 - 32 mmol/L 26 28  Calcium 8.9 - 10.3 mg/dL 8.8(L) 9.2     Hepatic Function Latest Ref Rng & Units 10/16/2016  Total Protein 6.0 - 8.5 g/dL 6.4  Albumin 3.5 - 5.5 g/dL 4.3  AST 0 - 40 IU/L 39  ALT 0 - 44 IU/L 55(H)  Alk Phosphatase 39 - 117 IU/L 77  Total Bilirubin 0.0 - 1.2 mg/dL 1.0  Bilirubin, Direct 0.00 - 0.40 mg/dL 0.22    CBC Latest Ref Rng & Units 08/06/2016 08/05/2016  WBC 4.0 - 10.5 K/uL 9.8 7.2  Hemoglobin 13.0 - 17.0 g/dL 15.1 15.8  Hematocrit 39.0 - 52.0 % 44.9 46.4  Platelets  150 - 400 K/uL 159 172   Lab Results  Component Value Date   MCV 88.0  08/06/2016   MCV 88.0 08/05/2016   No results found for: TSH No results found for: HGBA1C   BNP No results found for: BNP  ProBNP No results found for: PROBNP   Lipid Panel     Component Value Date/Time   CHOL 155 10/16/2016 0851   TRIG 132 10/16/2016 0851   HDL 41 10/16/2016 0851   CHOLHDL 3.8 10/16/2016 0851   LDLCALC 88 10/16/2016 0851     RADIOLOGY: No results found.   Additional studies/ records that were reviewed today include:   I reviewed his hospital records, records of Dr. Gwenlyn Found, as well as his sleep evaluations and download    ASSESSMENT:    1. OSA (obstructive sleep apnea)   2. Hyperlipidemia with target LDL less than 70   3. Medication management   4. Non-ST elevation (NSTEMI) myocardial infarction ([place]) 08/05/2016   5. CAD S/P percutaneous coronary angioplasty      PLAN:  Mr Ybarra is a 57 year old male who has a family history for CAD, and to himself suffered a non-ST segment elevation myocardial infarction on 08/05/2016.  He required 2 overlapping drug-eluting stents in his proximal RCA. His insurance company denied an in lab PSG but on home study ge was found to have severe sleep apnea. AHI was 46.4/h.  Since initiating CPAP therapy, he has felt improved.  However, his presure range was 4- 20 cwp.  I suspect his residually increased AHI at 6.9 is relathe fact that CPAP is initiating at to low pressure.  As result, I will increase is minimal  Pressure  Consequences of sleep apneato 7 with a range up to 15.  I spent a long time with the patient in the office today reviewing normal sleep architecture and the adverse effects of untreated seep apnea on sleep architecture.  The home study does not allow for evaluating stage of sleep and I suspect his rems sleep OSA is even more severe.  We discussed that the preponderance of REM sleep occurs in the second half of night in the importance of  continuation of treatment for the entire sleep duration.  We discussed optimal sleep at approximately 8 hours per night for an adult. Due to some irritation from the nasal pillows.  I have suggested the Respironics Dreamwear or ResMed N30i mask which I believe he wate better than his current nasal pillows and his previous fullface mask.  He is not requiring a high pressure. We discussed maintenance issues relative to his mask.  With his history of myocardial infarction.  I stressed to him the importance of optimizing LDL to at least less than 70 due to increasing cardiovascular risk at higher levels an the reduced risk at progressively lower LDL levels. Since he did not tolerate Crestor, Lipitor, or pravastatin, I have suggested a trial of Zetia until he sees Dr. Gwenlyn Found back.  If LDL is still greater than 70, he may be a candidate for Repatha and I reviewed with him the Fourier trial data.  I will schedule him for repeat laboratory in 3 months prior to his next office visit with Dr. Gwenlyn Found.    Medication Adjustments/Labs and Tests Ordered: Current medicines are reviewed at length with the patient today.  Concerns regarding medicines are outlined above.  Medication changes, Labs and Tests ordered today are listed in the Patient Instructions below. Patient Instructions  Medication Instructions:  START Zetia 10 mg daily  Labwork: Please return for FASTING labs in 3 months (CMET, Lipid)  Our in office  lab hours are Monday-Friday 8:00-4:00, closed for lunch 12:45-1:45 pm.  No appointment needed.  Follow-Up: 3-4 months with Dr. Gwenlyn Found  Your physician wants you to follow-up in: 12 months with Dr. Claiborne Billings (sleep clinic). You will receive a reminder letter in the mail two months in advance. If you don't receive a letter, please call our office to schedule the follow-up appointment.   Any Other Special Instructions Will Be Listed Below (If Applicable).     If you need a refill on your cardiac medications  before your next appointment, please call your pharmacy.      Signed, Shelva Majestic, MD  04/17/2017 3:38 PM    [facility] [address], [place], [place], [place]  [address] Phone: [phone]

## 2017-04-17 ENCOUNTER — Encounter: Payer: Self-pay | Admitting: Cardiovascular Disease

## 2017-06-15 ENCOUNTER — Encounter: Payer: Self-pay | Admitting: Cardiovascular Disease

## 2017-07-01 ENCOUNTER — Telehealth: Payer: Self-pay | Admitting: *Deleted

## 2017-07-01 NOTE — Telephone Encounter (Signed)
Patient notified of download report dated 05/17/17 to 06/15/17. No pressure changes were recommended. Patient encouraged to use machine every night all night.

## 2017-07-28 LAB — HEPATIC FUNCTION PANEL
ALBUMIN: 4.3 g/dL (ref 3.5–5.5)
ALT: 31 IU/L (ref 0–44)
AST: 20 IU/L (ref 0–40)
Alkaline Phosphatase: 76 IU/L (ref 39–117)
BILIRUBIN, DIRECT: 0.12 mg/dL (ref 0.00–0.40)
Bilirubin Total: 0.4 mg/dL (ref 0.0–1.2)
TOTAL PROTEIN: 6.2 g/dL (ref 6.0–8.5)

## 2017-07-28 LAB — LIPID PANEL
CHOL/HDL RATIO: 4.9 ratio (ref 0.0–5.0)
Cholesterol, Total: 172 mg/dL (ref 100–199)
HDL: 35 mg/dL — AB (ref >39)
Triglycerides: 402 mg/dL — ABNORMAL HIGH (ref 0–149)

## 2017-07-30 ENCOUNTER — Encounter: Payer: Self-pay | Admitting: *Deleted

## 2017-07-30 NOTE — Telephone Encounter (Signed)
This encounter was created in error - please disregard.

## 2017-07-30 NOTE — Telephone Encounter (Signed)
-----   Message from Runell GessJonathan J Berry, MD sent at 07/29/2017  7:44 AM EDT ----- Start fenofibrate 160 mg and repeat in 3 months.

## 2017-08-03 ENCOUNTER — Ambulatory Visit (INDEPENDENT_AMBULATORY_CARE_PROVIDER_SITE_OTHER): Payer: BLUE CROSS/BLUE SHIELD | Admitting: Cardiovascular Disease

## 2017-08-03 ENCOUNTER — Encounter: Payer: Self-pay | Admitting: Cardiovascular Disease

## 2017-08-03 DIAGNOSIS — Z9861 Coronary angioplasty status: Secondary | ICD-10-CM

## 2017-08-03 DIAGNOSIS — E785 Hyperlipidemia, unspecified: Secondary | ICD-10-CM

## 2017-08-03 DIAGNOSIS — I2510 Atherosclerotic heart disease of native coronary artery without angina pectoris: Secondary | ICD-10-CM | POA: Diagnosis not present

## 2017-08-03 LAB — LIPID PANEL
CHOL/HDL RATIO: 4.1 ratio (ref 0.0–5.0)
CHOLESTEROL TOTAL: 155 mg/dL (ref 100–199)
HDL: 38 mg/dL — ABNORMAL LOW (ref >39)
LDL CALC: 96 mg/dL (ref 0–99)
Triglycerides: 107 mg/dL (ref 0–149)
VLDL Cholesterol Cal: 21 mg/dL (ref 5–40)

## 2017-08-03 LAB — HEPATIC FUNCTION PANEL
ALBUMIN: 4.4 g/dL (ref 3.5–5.5)
ALT: 28 IU/L (ref 0–44)
AST: 22 IU/L (ref 0–40)
Alkaline Phosphatase: 64 IU/L (ref 39–117)
BILIRUBIN TOTAL: 0.8 mg/dL (ref 0.0–1.2)
Bilirubin, Direct: 0.19 mg/dL (ref 0.00–0.40)
Total Protein: 6.3 g/dL (ref 6.0–8.5)

## 2017-08-03 MED ORDER — CLOPIDOGREL BISULFATE 75 MG PO TABS: 75.0000 mg | ORAL_TABLET | Freq: Every day | ORAL | 3 refills | Status: DC

## 2017-08-03 NOTE — Assessment & Plan Note (Signed)
History of essential hypertension her blood pressure measured today at 118/74.  He is on losartan.  Continue current meds at current dosing.

## 2017-08-03 NOTE — Assessment & Plan Note (Signed)
History of CAD status post RCA PCI and stenting in the setting of non-STEMI 02/06/2016 with implantation of 2 synergy drug-eluting stents in the mid RCA.  His left system was free of disease.  He had normal LV function.  He is remained on dual antiplatelet therapy and is been asymptomatic.  I am going to transition him to Plavix.  He will be able to interrupt his antiplatelet therapy after this if required

## 2017-08-03 NOTE — Assessment & Plan Note (Signed)
History of dyslipidemia intolerant to statin therapy on Zetia.  We will recheck a lipid and liver profile today.  He may be a candidate for Repatha.

## 2017-08-03 NOTE — Patient Instructions (Addendum)
Medication Instructions:  Your physician has recommended you make the following change in your medication:  1) STOP Brilinta  2) START Plavix 75 mg tablet by mouth ONCE    Labwork: Your physician recommends that you return for lab work in: TODAY (FASTING)   Testing/Procedures: none  Follow-Up: Your physician wants you to follow-up in: 12 months with Dr. Allyson SabalBerry. You will receive a reminder letter in the mail two months in advance. If you don't receive a letter, please call our office to schedule the follow-up appointment.   Any Other Special Instructions Will Be Listed Below (If Applicable).     If you need a refill on your cardiac medications before your next appointment, please call your pharmacy.

## 2017-08-03 NOTE — Progress Notes (Signed)
08/03/2017 Keith Hunter   08/14/1960  [id number]  Primary Physician Gordan PaymentGrisso, Greg A., MD Primary Cardiologist: Runell GessJonathan J Javana Schey MD Nicholes CalamityFACP, FACC, FAHA, [place]  HPI:  Keith Hunter is a 57 y.o. married Caucasian male with no children is accompanied by his wife today.  I last saw him in the office 10/23/2016.  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 He basically has no cardiac risk factors other than family history the father has had 8 stents beginning in his 260s and a brother less dense as well. He had a non-STEMI 08/05/16. I performed radial cath on him and placed 2 overlapping drug-eluting stents in its proximal dominant RCA. The remainder of his coronary anatomy was free of significant disease. Normal LV function. He denies chest pain or shortness of breath. He remains on dual antiplatelet therapy.  He is statin intolerant and is currently on Zetia.     Current Meds  Medication Sig  . aspirin EC 81 MG EC tablet Take 1 tablet (81 mg total) by mouth daily.  . Coenzyme Q10 (COQ10) 200 MG CAPS Take 200 mg by mouth daily.  . Flaxseed, Linseed, (FLAXSEED OIL PO) Take 1 capsule by mouth daily.  Marland Kitchen. losartan (COZAAR) 50 MG tablet Take 50 mg by mouth daily.  . nitroGLYCERIN (NITROSTAT) 0.4 MG SL tablet Place 1 tablet (0.4 mg total) under the tongue every 5 (five) minutes x 3 doses as needed for chest pain.  . Omega-3 Fatty Acids (FISH OIL PO) Take 1 capsule by mouth daily.  . [DISCONTINUED] BRILINTA 90 MG TABS tablet TAKE 1 TABLET BY MOUTH TWICE A DAY     No Known Allergies  Social History   Socioeconomic History  . Marital status: Married    Spouse name: Not on file  . Number of children: Not on file  . Years of education: Not on file  . Highest education level: Not on file  Occupational History  . Not on file  Social Needs  . Financial resource strain: Not on file  . Food insecurity:    Worry: Not on file    Inability: Not on file  .  Transportation needs:    Medical: Not on file    Non-medical: Not on file  Tobacco Use  . Smoking status: Never Smoker  . Smokeless tobacco: Never Used  Substance and Sexual Activity  . Alcohol use: No  . Drug use: No  . Sexual activity: Yes  Lifestyle  . Physical activity:    Days per week: Not on file    Minutes per session: Not on file  . Stress: Not on file  Relationships  . Social connections:    Talks on phone: Not on file    Gets together: Not on file    Attends religious service: Not on file    Active member of club or organization: Not on file    Attends meetings of clubs or organizations: Not on file    Relationship status: Not on file  . Intimate partner violence:    Fear of current or ex partner: Not on file    Emotionally abused: Not on file    Physically abused: Not on file    Forced sexual activity: Not on file  Other Topics Concern  . Not on file  Social History Narrative  . Not on file     Review of Systems: General: negative for chills, fever, night sweats or  weight changes.  Cardiovascular: negative for chest pain, dyspnea on exertion, edema, orthopnea, palpitations, paroxysmal nocturnal dyspnea or shortness of breath Dermatological: negative for rash Respiratory: negative for cough or wheezing Urologic: negative for hematuria Abdominal: negative for nausea, vomiting, diarrhea, bright red blood per rectum, melena, or hematemesis Neurologic: negative for visual changes, syncope, or dizziness All other systems reviewed and are otherwise negative except as noted above.    Blood pressure 118/74, pulse 75, height 5\' 10"  (1.778 m), weight 232 lb (105.2 kg).  General appearance: alert and no distress Neck: no adenopathy, no carotid bruit, no JVD, supple, symmetrical, trachea midline and thyroid not enlarged, symmetric, no tenderness/mass/nodules Lungs: clear to auscultation bilaterally Heart: regular rate and rhythm, S1, S2 normal, no murmur, click, rub  or gallop Extremities: extremities normal, atraumatic, no cyanosis or edema Pulses: 2+ and symmetric Skin: Skin color, texture, turgor normal. No rashes or lesions Neurologic: Alert and oriented X 3, normal strength and tone. Normal symmetric reflexes. Normal coordination and gait  EKG not performed today  ASSESSMENT AND PLAN:   CAD S/P percutaneous coronary angioplasty History of CAD status post RCA PCI and stenting in the setting of non-STEMI 02/06/2016 with implantation of 2 synergy drug-eluting stents in the mid RCA.  His left system was free of disease.  He had normal LV function.  He is remained on dual antiplatelet therapy and is been asymptomatic.  I am going to transition him to Plavix.  He will be able to interrupt his antiplatelet therapy after this if required  Dyslipidemia History of dyslipidemia intolerant to statin therapy on Zetia.  We will recheck a lipid and liver profile today.  He may be a candidate for Repatha.  Essential hypertension History of essential hypertension her blood pressure measured today at 118/74.  He is on losartan.  Continue current meds at current dosing.      Runell Gess MD FACP,FACC,FAHA, [facility] 08/03/2017 9:13 AM

## 2017-08-04 ENCOUNTER — Encounter: Payer: Self-pay | Admitting: *Deleted

## 2017-09-06 ENCOUNTER — Telehealth: Payer: Self-pay | Admitting: Cardiovascular Disease

## 2017-09-06 NOTE — Telephone Encounter (Signed)
Okay to interrupt antiplatelet therapy.  Patient cleared for arthroscopy at low risk.

## 2017-09-06 NOTE — Telephone Encounter (Signed)
   Primary Cardiologist: Nanetta BattyJonathan Berry, MD  Chart reviewed as part of pre-operative protocol coverage. Patient was contacted 09/06/2017 in reference to pre-operative risk assessment for pending surgery as outlined below.  Keith Hunter was last seen on 08/03/17 by Dr. Allyson SabalBerry who transitioned him from Brilinta to Plavix. He does comment the patient would be able to interrupt antiplatelet therapy after this if required. Will route to Dr. Allyson SabalBerry to find out how long he is comfortable holding this for. Dr. Allyson SabalBerry - - Please route response to [facility] PREOP (the pre-op pool). Thank you.  Callback, please let surgeon's office know they usually have to hold the plavix 5-7 days beforehand. We got the clearance today for procedure in 2 days so they will need to put the procedure on hold until Dr. Hazle CocaBerry's reply and the holding of Plavix has actually taken place.  Laurann [place] Dunn, PA-C 09/06/2017, 3:11 PM

## 2017-09-06 NOTE — Telephone Encounter (Signed)
Left a detailed message for Dr. Tobin ChadLucey's office to call us back re: surgical clearance for pt.

## 2017-09-06 NOTE — Telephone Encounter (Signed)
° °  [facility] Pre-operative Risk Assessment    Request for surgical clearance:  1. What type of surgery is being performed? Rt Knee Scope Menisectomy   2. When is this surgery scheduled? 08 21 19  3. What type of clearance is required (medical clearance vs. Pharmacy clearance to hold med vs. Both)? both  4. Are there any medications that need to be held prior to surgery and how long? Plavix -prior and after  5. Practice name and name of physician performing surgery?  Dr Lara Mulch   6. What is your office phone number [phone]    7.   What is your office fax number [phone]  8.   Anesthesia type (None, local, MAC, general) ? General   Keith Hunter 09/06/2017, 9:41 AM  _________________________________________________________________   (provider comments below)

## 2017-09-06 NOTE — Telephone Encounter (Signed)
Spoke with Selena BattenKim at Dr. Tobin ChadLucey;s office re: surgical clearance. She has advised that they don't need clearance for pt to stop any anticoags, that they just needed cardiac clearance, is pt medically stable, to have the scope.  She did advise that when she speak with the pt, that he did advise her that he has stopped his Plavix on his own about 2 weeks ago.  Per Ronie Spiesayna Dunn, PA-C, preop coverage, I will route this to Dr. Allyson SabalBerry to give advice on his clearance.

## 2017-09-07 ENCOUNTER — Telehealth: Payer: Self-pay | Admitting: Cardiovascular Disease

## 2017-09-07 NOTE — Telephone Encounter (Signed)
Note not needed 

## 2017-09-07 NOTE — Telephone Encounter (Signed)
Routed to Dr. Crecencio McLucey  Abdel Effinger C. Desare Duddy, RN, [facility] [address] [place], [place]  [id number] [phone]

## 2017-09-14 DIAGNOSIS — Z9889 Other specified postprocedural states: Secondary | ICD-10-CM

## 2017-09-14 HISTORY — DX: Other specified postprocedural states: Z98.890

## 2017-10-28 DIAGNOSIS — G8929 Other chronic pain: Secondary | ICD-10-CM

## 2017-10-28 HISTORY — DX: Other chronic pain: G89.29

## 2017-11-07 IMAGING — CR DG CHEST 2V
2 series · 2 of 2 positions shown · non-contrast
Comparison: No prior .

CLINICAL DATA: Chest pain.

EXAM:
CHEST  2 VIEW

[chest pa]
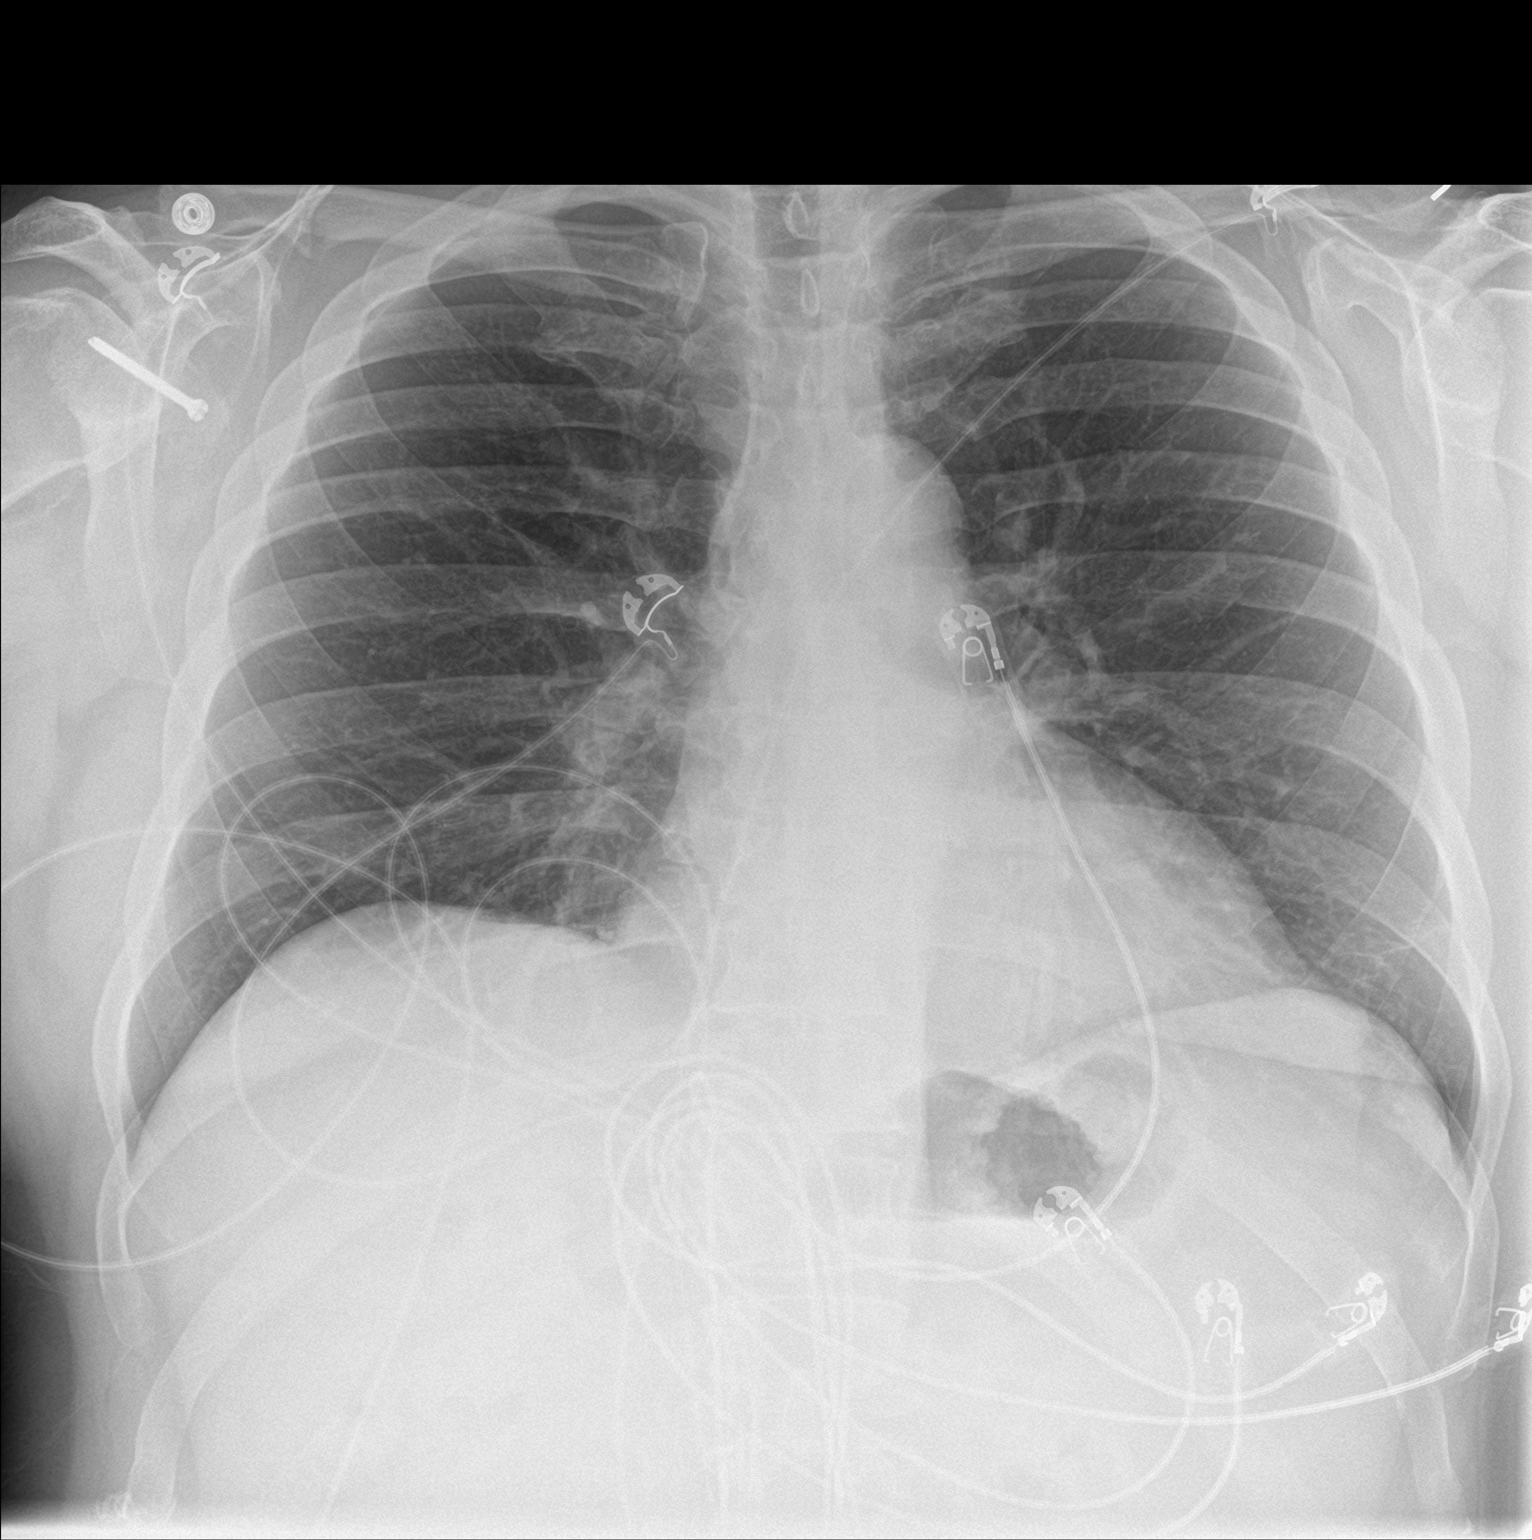

[chest lat]
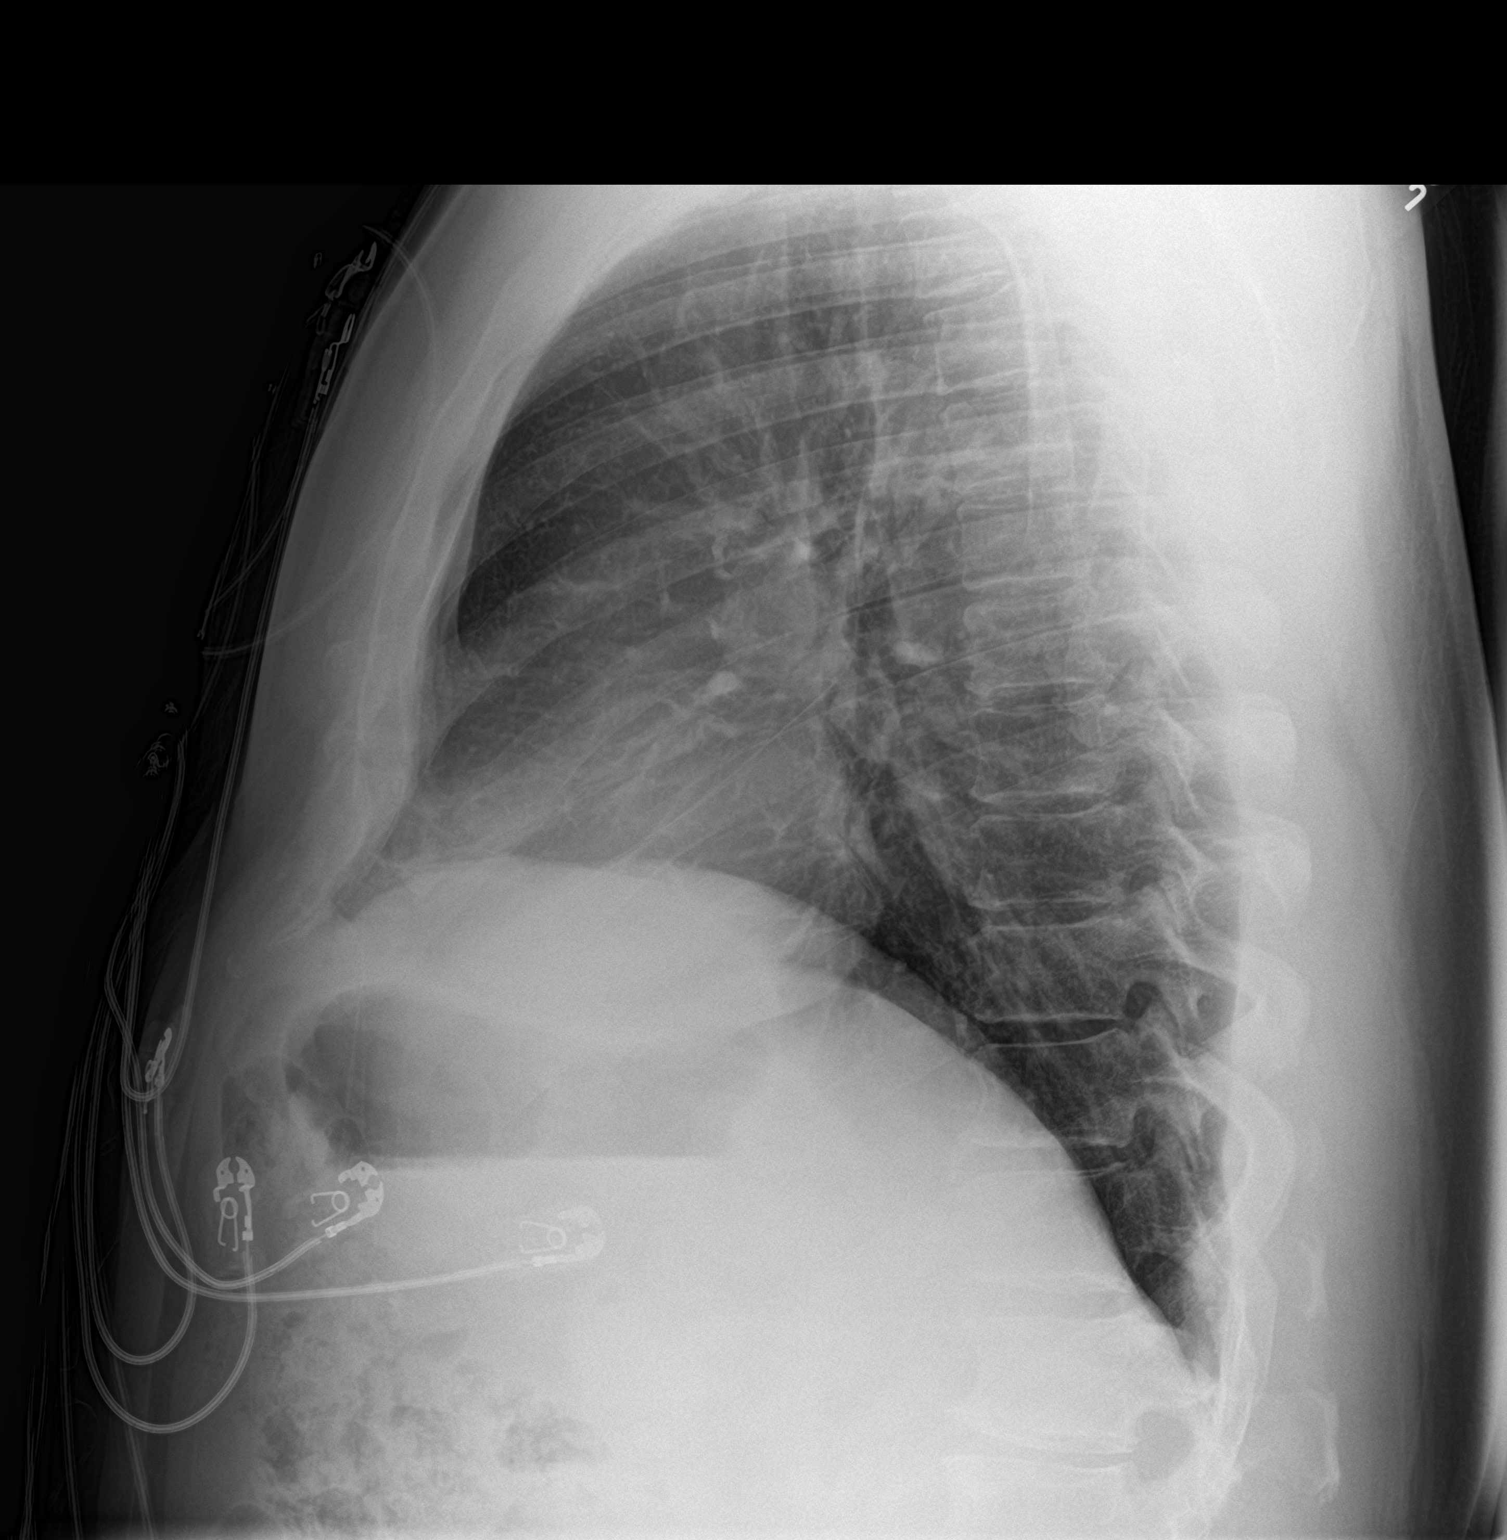

[2 of 2 positions shown; findings below may reference images not displayed]

FINDINGS: Mediastinum and hilar structures normal. Heart size normal. No focal
infiltrate. No pleural effusion or pneumothorax. Surgical screw
noted over the right shoulder .
IMPRESSION: No acute cardiopulmonary disease.

## 2017-11-29 ENCOUNTER — Ambulatory Visit: Payer: BLUE CROSS/BLUE SHIELD | Admitting: Cardiology

## 2017-11-29 ENCOUNTER — Encounter: Payer: Self-pay | Admitting: Cardiology

## 2017-11-29 VITALS — BP 130/86 | HR 70 | Ht 71.0 in | Wt 239.2 lb

## 2017-11-29 DIAGNOSIS — E785 Hyperlipidemia, unspecified: Secondary | ICD-10-CM | POA: Diagnosis not present

## 2017-11-29 DIAGNOSIS — Z9861 Coronary angioplasty status: Secondary | ICD-10-CM

## 2017-11-29 DIAGNOSIS — I2510 Atherosclerotic heart disease of native coronary artery without angina pectoris: Secondary | ICD-10-CM | POA: Diagnosis not present

## 2017-11-29 MED ORDER — PITAVASTATIN CALCIUM 2 MG PO TABS: ORAL_TABLET | ORAL | 3 refills | Status: DC

## 2017-11-29 MED ORDER — EVOLOCUMAB 140 MG/ML ~~LOC~~ SOAJ: 140.0000 mg | SUBCUTANEOUS | 3 refills | Status: DC

## 2017-11-29 NOTE — Patient Instructions (Signed)
Medication Instructions:  Your physician has recommended you make the following change in your medication:   STOP aspirin  START pitavastatin (livalo) 2 mg: Take 1 tablet daily on Monday, Wednesday, and Friday START repatha 140 mg: Inject 1 pen into the subcutaneous tissue every 14 days  If you need a refill on your cardiac medications before your next appointment, please call your pharmacy.   Lab work: Your physician recommends that you return for lab work in 1 month: CMP, Lipid panel, Lipoprotein A (LPA). No appointment needed, please fast beforehand.   If you have labs (blood work) drawn today and your tests are completely normal, you will receive your results only by: Marland Kitchen MyChart Message (if you have MyChart) OR . A paper copy in the mail If you have any lab test that is abnormal or we need to change your treatment, we will call you to review the results.  Testing/Procedures: You had an EKG today.   Your physician has requested that you have a stress echocardiogram. For further information please visit [email or web address] Please follow instruction sheet as given.  Follow-Up: At [facility], you and your health needs are our priority.  As part of our continuing mission to provide you with exceptional heart care, we have created designated Provider Care Teams.  These Care Teams include your primary Cardiologist (physician) and Advanced Practice Providers (APPs -  Physician Assistants and Nurse Practitioners) who all work together to provide you with the care you need, when you need it. You will need a follow up appointment in 3 months.  Please call our office 2 months in advance to schedule this appointment.      Exercise Stress Electrocardiogram An exercise stress electrocardiogram is a test to check how blood flows to your heart. It is done to find areas of poor blood flow. You will need to walk on a treadmill for this test. The electrocardiogram will record your heartbeat when  you are at rest and when you are exercising. What happens before the procedure?  Do not have drinks with caffeine or foods with caffeine for 24 hours before the test, or as told by your doctor. This includes coffee, tea (even decaf tea), sodas, chocolate, and cocoa.  Follow your doctor's instructions about eating and drinking before the test.  Ask your doctor what medicines you should or should not take before the test. Take your medicines with water unless told by your doctor not to.  If you use an inhaler, bring it with you to the test.  Bring a snack to eat after the test.  Do not  smoke for 4 hours before the test.  Do not put lotions, powders, creams, or oils on your chest before the test.  Wear comfortable shoes and clothing. What happens during the procedure?  You will have patches put on your chest. Small areas of your chest may need to be shaved. Wires will be connected to the patches.  Your heart rate will be watched while you are resting and while you are exercising.  You will walk on the treadmill. The treadmill will slowly get faster to raise your heart rate.  The test will take about 1-2 hours. What happens after the procedure?  Your heart rate and blood pressure will be watched after the test.  You may return to your normal diet, activities, and medicines or as told by your doctor. This information is not intended to replace advice given to you by your health care provider. Make sure  you discuss any questions you have with your health care provider. Document Released: 06/24/2007 Document Revised: 09/04/2015 Document Reviewed: 09/12/2012 Elsevier Interactive Patient Education  2018 [employer].    Pitavastatin oral tablets What is this medicine? PITAVASTATIN (pit A va STAT in) is known as a HMG-CoA reductase inhibitor or 'statin'. It lowers the level of cholesterol and triglycerides in the blood. Diet and lifestyle changes are often used with this drug. This  medicine may be used for other purposes; ask your health care provider or pharmacist if you have questions. COMMON BRAND NAME(S): Livalo What should I tell my health care provider before I take this medicine? They need to know if you have any of these conditions: - frequently drink alcoholic beverages - kidney disease - liver disease - muscle aches or weakness - other medical condition - an unusual or allergic reaction to pitavastatin, other medicines, foods, dyes, or preservatives - pregnant or trying to get pregnant - breast-feeding How should I use this medicine? Take this medicine by mouth with a glass of water. Follow the directions on the prescription label. You can take this medicine with or without food. Take your doses at regular intervals. Do not take your medicine more often than directed. Talk to your pediatrician regarding the use of this medicine in children. Special care may be needed. Overdosage: If you think you have taken too much of this medicine contact a poison control center or emergency room at once. NOTE: This medicine is only for you. Do not share this medicine with others. What if I miss a dose? If you miss a dose, take it as soon as you can. If it is almost time for your next dose, take only that dose. Do not take double or extra doses. What may interact with this medicine? Do not take this medicine with any of the following medications: - cyclosporine - gemfibrozil - herbal medicines like red yeast rice This medicine may also interact with the following medications: - alcohol - antiviral medicines for HIV or AIDS - erythromycin - other medicines for cholesterol - rifampin - warfarin This list may not describe all possible interactions. Give your health care provider a list of all the medicines, herbs, non-prescription drugs, or dietary supplements you use. Also tell them if you smoke, drink alcohol, or use illegal drugs. Some items may interact with your  medicine. What should I watch for while using this medicine? Visit your doctor or health care professional for regular check-ups. You may need regular tests to make sure your liver is working properly. Tell your doctor or health care professional right away if you get any unexplained muscle pain, tenderness, or weakness, especially if you also have a fever and tiredness. Your doctor or health care professional may tell you to stop taking this medicine if you develop muscle problems. If your muscle problems do not go away after stopping this medicine, contact your health care professional. This medicine may affect blood sugar levels. If you have diabetes, check with your doctor or health care professional before you change your diet or the dose of your diabetic medicine. This drug is only part of a total heart-health program. Your doctor or a dietician can suggest a low-cholesterol and low-fat diet to help. Avoid alcohol and smoking, and keep a proper exercise schedule. Do not become pregnant while taking this medicine. Women should inform their doctor if they wish to become pregnant or think they might be pregnant. There is a potential for serious side  effects to an unborn child. Do not breast-feed an infant while taking this medicine. Serious side effects to an unborn child or to an infant are possible. Talk to your doctor or pharmacist for more information. What side effects may I notice from receiving this medicine? Side effects that you should report to your doctor or health care professional as soon as possible: - allergic reactions like skin rash, itching or hives, swelling of the face, lips, or tongue - dark urine - fever - joint pain - muscle cramps or pain - redness, blistering, peeling or loosening of the skin, including inside the mouth - trouble passing urine or change in the amount of urine - unusually weak or tired - yellowing of the eyes or skin Side effects that usually do not  require medical attention (report to your doctor or health care professional if they continue or are bothersome): - constipation - heartburn - nausea This list may not describe all possible side effects. Call your doctor for medical advice about side effects. You may report side effects to FDA at 1-800-FDA-1088. Where should I keep my medicine? Keep out of the reach of children. Store at room temperature between 15 and 30 degrees C (59 and 86 degrees F). Protect from light. Throw away any unused medicine after the expiration date. NOTE: This sheet is a summary. It may not cover all possible information. If you have questions about this medicine, talk to your doctor, pharmacist, or health care provider.  2018 Elsevier/Gold Standard (2015-02-07 12:24:24)   Evolocumab injection What is this medicine? EVOLOCUMAB (e voe LOK ue mab) is known as a PCSK9 inhibitor. It is used to lower the level of cholesterol in the blood. It may be used alone or in combination with other cholesterol-lowering drugs. This drug may also be used to reduce the risk of heart attack, stroke, and certain types of heart surgery in patients with heart disease. This medicine may be used for other purposes; ask your health care provider or pharmacist if you have questions. COMMON BRAND NAME(S): REPATHA What should I tell my health care provider before I take this medicine? They need to know if you have any of these conditions: -an unusual or allergic reaction to evolocumab, other medicines, foods, dyes, or preservatives -pregnant or trying to get pregnant -breast-feeding How should I use this medicine? This medicine is for injection under the skin. You will be taught how to prepare and give this medicine. Use exactly as directed. Take your medicine at regular intervals. Do not take your medicine more often than directed. It is important that you put your used needles and syringes in a special sharps container. Do not put them  in a trash can. If you do not have a sharps container, call your pharmacist or health care provider to get one. Talk to your pediatrician regarding the use of this medicine in children. While this drug may be prescribed for children as young as 13 years for selected conditions, precautions do apply. Overdosage: If you think you have taken too much of this medicine contact a poison control center or emergency room at once. NOTE: This medicine is only for you. Do not share this medicine with others. What if I miss a dose? If you miss a dose, take it as soon as you can if there are more than 7 days until the next scheduled dose, or skip the missed dose and take the next dose according to your original schedule. Do not take double or  extra doses. What may interact with this medicine? Interactions are not expected. This list may not describe all possible interactions. Give your health care provider a list of all the medicines, herbs, non-prescription drugs, or dietary supplements you use. Also tell them if you smoke, drink alcohol, or use illegal drugs. Some items may interact with your medicine. What should I watch for while using this medicine? You may need blood work while you are taking this medicine. What side effects may I notice from receiving this medicine? Side effects that you should report to your doctor or health care professional as soon as possible: -allergic reactions like skin rash, itching or hives, swelling of the face, lips, or tongue -signs and symptoms of infection like fever or chills; cough; sore throat; pain or trouble passing urine Side effects that usually do not require medical attention (report to your doctor or health care professional if they continue or are bothersome): -diarrhea -nausea -muscle pain -pain, redness, or irritation at site where injected This list may not describe all possible side effects. Call your doctor for medical advice about side effects. You may  report side effects to FDA at 1-800-FDA-1088. Where should I keep my medicine? Keep out of the reach of children. You will be instructed on how to store this medicine. Throw away any unused medicine after the expiration date on the label. NOTE: This sheet is a summary. It may not cover all possible information. If you have questions about this medicine, talk to your doctor, pharmacist, or health care provider.  2018 Elsevier/Gold Standard (2015-12-23 13:21:53)

## 2017-11-29 NOTE — Progress Notes (Signed)
Cardiology Office Note:    Date:  11/29/2017   ID:  Keith Hunter, DOB 05/10/1960, MRN [id number]  PCP:  Raina Mina., MD  Cardiologist:  Shirlee More, MD    Referring MD: Raina Mina., MD    ASSESSMENT:    1. CAD S/P percutaneous coronary angioplasty   2. Dyslipidemia    PLAN:    In order of problems listed above:  1. Stable he will transition to single antiplatelet drug clopidogrel.  Initiate lipid-lowering therapy with PCSK9 and low-dose background statin livalo although he never achieve target with this treatment.  Follow-up labs lipids liver LP(a) 1 month.  He is at high risk for recurrent ischemia and I have asked him to set up a stress echocardiogram in view of his fatigue.  He will follow-up me in the office in 3 months   Next appointment: 3 months   Medication Adjustments/Labs and Tests Ordered: Current medicines are reviewed at length with the patient today.  Concerns regarding medicines are outlined above.  Orders Placed This Encounter  Procedures  . Comp Met (CMET)  . Lipid Profile  . Lipoprotein A (LPA)  . EKG 12-Lead  . ECHOCARDIOGRAM STRESS TEST   Meds ordered this encounter  Medications  . Pitavastatin Calcium 2 MG TABS    Sig: Take 1 tablet (2 mg) by mouth daily on Monday, Wednesday, and Friday.    Dispense:  30 tablet    Refill:  3  . Evolocumab (REPATHA SURECLICK) 545 MG/ML SOAJ    Sig: Inject 140 mg into the skin every 14 (fourteen) days.    Dispense:  2 pen    Refill:  3    Chief Complaint  Patient presents with  . Follow-up  . Coronary Artery Disease  . Hyperlipidemia    History of Present Illness:    Keith Hunter is a 57 y.o. male with a hx of CAD with non-STEMI 08/05/16   last seen 08/03/17. Compliance with diet, lifestyle and medications: Yes  He has proven intolerant of statins with joint and muscle pain including a atorvastatin rosuvastatin and pravastatin.  Even off statins he does complain of diffuse muscle and joint  pain as well as fatigue he has had no chest pain palpitation or syncope.  His lipid profile is consistent with familial hyperlipidemia.  He is at high risk with known CAD anterior MI I asked him to undergo further evaluation for evaluation with stress echocardiogram.  More than 1 year from PCI moved down titrate to a single antiplatelet drug.  Follow-up labs in 1 month after resuming a minimum dose of statin as background therapy Livalo 3 days a week and starting PCSK9. Past Medical History:  Diagnosis Date  . CAD (coronary artery disease), native coronary artery    a. Cath 08/05/16 - 99% pRCA & 50%mRCA s/p PTCA and  overlapping synergy drug-eluting stents  . Chest pain in adult 08/04/2016  . Mixed hyperlipidemia 04/24/2015   Last Assessment & Plan:  Relevant Hx: Course: Daily Update: Today's Plan:this is going to be updated today and will see how he is with this  Electronically signed by: Mayer Camel, NP 04/25/15 1119  . NSTEMI (non-ST elevated myocardial infarction) ([place]) 08/05/2016  . Primary osteoarthritis involving multiple joints 04/25/2015   Last Assessment & Plan:  Relevant Hx: Course: Daily Update: Today's Plan:discussed this in depth with him and he is using some OTC meds for this when he golfs and he has tried glucosamine which did not seem to  help him much at all  Electronically signed by: Mayer Camel, NP 04/25/15 1118  . Snoring 04/25/2015   Last Assessment & Plan:  Relevant Hx: Course: Daily Update: Today's Plan:long discussion with him about this and he is advised that he should have sleep study which he does not want to and is going to investigate having mouth piece  Electronically signed by: Mayer Camel, NP 04/25/15 1122    Past Surgical History:  Procedure Laterality Date  . CORONARY ANGIOPLASTY WITH STENT PLACEMENT  08/05/2016  . CORONARY STENT INTERVENTION N/A 08/05/2016   Procedure: Coronary Stent Intervention;  Surgeon: Lorretta Harp,  MD;  Location: [facility];  Service: Cardiovascular;  Laterality: N/A;  . LEFT HEART CATH AND CORONARY ANGIOGRAPHY N/A 08/05/2016   Procedure: Left Heart Cath and Coronary Angiography;  Surgeon: Lorretta Harp, MD;  Location: [place] [facility];  Service: Cardiovascular;  Laterality: N/A;  . SHOULDER OPEN ROTATOR CUFF REPAIR Right 1981    Current Medications: Current Meds  Medication Sig  . clopidogrel (PLAVIX) 75 MG tablet Take 1 tablet (75 mg total) by mouth daily.  . Coenzyme Q10 (COQ10) 200 MG CAPS Take 200 mg by mouth daily.  . Flaxseed, Linseed, (FLAXSEED OIL PO) Take 1 capsule by mouth daily.  Marland Kitchen losartan (COZAAR) 50 MG tablet Take 50 mg by mouth daily.  . nitroGLYCERIN (NITROSTAT) 0.4 MG SL tablet Place 1 tablet (0.4 mg total) under the tongue every 5 (five) minutes x 3 doses as needed for chest pain.  . Omega-3 Fatty Acids (FISH OIL PO) Take 1 capsule by mouth 2 (two) times daily.   . [DISCONTINUED] aspirin EC 81 MG EC tablet Take 1 tablet (81 mg total) by mouth daily.     Allergies:   Statins   Social History   Socioeconomic History  . Marital status: Married    Spouse name: Not on file  . Number of children: Not on file  . Years of education: Not on file  . Highest education level: Not on file  Occupational History  . Not on file  Social Needs  . Financial resource strain: Not on file  . Food insecurity:    Worry: Not on file    Inability: Not on file  . Transportation needs:    Medical: Not on file    Non-medical: Not on file  Tobacco Use  . Smoking status: Never Smoker  . Smokeless tobacco: Never Used  Substance and Sexual Activity  . Alcohol use: No  . Drug use: No  . Sexual activity: Yes  Lifestyle  . Physical activity:    Days per week: Not on file    Minutes per session: Not on file  . Stress: Not on file  Relationships  . Social connections:    Talks on phone: Not on file    Gets together: Not on file    Attends religious service: Not  on file    Active member of club or organization: Not on file    Attends meetings of clubs or organizations: Not on file    Relationship status: Not on file  Other Topics Concern  . Not on file  Social History Narrative  . Not on file     Family History: The patient's family history includes Heart disease in his brother and father. ROS:   Please see the history of present illness.    All other systems reviewed and are negative.  EKGs/Labs/Other Studies Reviewed:  The following studies were reviewed today:  EKG:  EKG ordered today.  The ekg ordered today demonstrates sinus rhythm consider inferior MI  Recent Labs: 10/07/2017 cholesterol 175 HDL 38 LDL 139.  His baseline pretreatment LDL was 221.  CMP was normal last month. 08/03/2017: ALT 28  Recent Lipid Panel    Component Value Date/Time   CHOL 155 08/03/2017 0930   TRIG 107 08/03/2017 0930   HDL 38 (L) 08/03/2017 0930   CHOLHDL 4.1 08/03/2017 0930   LDLCALC 96 08/03/2017 0930    Physical Exam:    VS:  BP 130/86 (BP Location: Right Arm, Patient Position: Sitting, Cuff Size: Large)   Pulse 70   Ht _0  (1.803 m)   Wt 239 lb 3.2 oz (108.5 kg)   SpO2 97%   BMI 33.36 kg/m     Wt Readings from Last 3 Encounters:  11/29/17 239 lb 3.2 oz (108.5 kg)  08/03/17 232 lb (105.2 kg)  04/15/17 234 lb (106.1 kg)     GEN:  Well nourished, well developed in no acute distress HEENT: Normal NECK: No JVD; No carotid bruits LYMPHATICS: No lymphadenopathy CARDIAC: RRR, no murmurs, rubs, gallops RESPIRATORY:  Clear to auscultation without rales, wheezing or rhonchi  ABDOMEN: Soft, non-tender, non-distended MUSCULOSKELETAL:  No edema; No deformity  SKIN: Warm and dry NEUROLOGIC:  Alert and oriented x 3 PSYCHIATRIC:  Normal affect    Signed, Shirlee More, MD  11/29/2017 11:10 AM    [place]

## 2017-12-06 ENCOUNTER — Other Ambulatory Visit: Payer: BLUE CROSS/BLUE SHIELD

## 2017-12-07 ENCOUNTER — Telehealth: Payer: Self-pay | Admitting: Cardiology

## 2017-12-07 DIAGNOSIS — I2510 Atherosclerotic heart disease of native coronary artery without angina pectoris: Secondary | ICD-10-CM

## 2017-12-07 DIAGNOSIS — Z9861 Coronary angioplasty status: Principal | ICD-10-CM

## 2017-12-07 NOTE — Telephone Encounter (Signed)
Patient has declined stress echo due to Insurance Ded is very high, by chance can we order GXT and this would be a lower price for patient and will it help with the reason for teesting??

## 2017-12-07 NOTE — Telephone Encounter (Signed)
ALSO, patient is having issue woth pharmacy and Repatha and ins is not ging to pay... He needs to touch base on this.Marland Kitchen..Marland Kitchen

## 2017-12-08 NOTE — Telephone Encounter (Signed)
Please advise if stress echocardiogram can be changed to a GXT. Thanks!

## 2017-12-08 NOTE — Telephone Encounter (Signed)
Yes for treadmill stress EKG test

## 2017-12-09 NOTE — Addendum Note (Signed)
Addended by: Crist FatLOCKHART, CATHERINE P on: 12/09/2017 02:00 PM   Modules accepted: Orders

## 2017-12-09 NOTE — Telephone Encounter (Signed)
Spoke with patient to let him know that Dr. Dulce SellarMunley agreed to changing the stress echocardiogram to a ETT. Patient states he will not schedule until he knows how much it costs. Misty StanleyLisa, at the front desk in [place], is checking on the price and will update patient when she has more information. If patient is agreeable, Misty StanleyLisa will schedule the ETT.

## 2017-12-10 ENCOUNTER — Other Ambulatory Visit: Payer: BLUE CROSS/BLUE SHIELD

## 2017-12-14 ENCOUNTER — Encounter: Payer: Self-pay | Admitting: *Deleted

## 2017-12-14 DIAGNOSIS — E7849 Other hyperlipidemia: Secondary | ICD-10-CM

## 2017-12-14 HISTORY — DX: Other hyperlipidemia: E78.49

## 2017-12-14 NOTE — Telephone Encounter (Signed)
Called patient to let him the prior authorization for repatha has been approved by his insurance. Advised patient to come by our office to pick up a copay card before picking up his prescription. Patient verbalized understanding and will pick it up tomorrow. No further questions.

## 2017-12-29 ENCOUNTER — Other Ambulatory Visit: Payer: Self-pay | Admitting: Cardiovascular Disease

## 2017-12-29 ENCOUNTER — Encounter (INDEPENDENT_AMBULATORY_CARE_PROVIDER_SITE_OTHER): Payer: Self-pay

## 2017-12-29 ENCOUNTER — Ambulatory Visit (INDEPENDENT_AMBULATORY_CARE_PROVIDER_SITE_OTHER): Payer: BLUE CROSS/BLUE SHIELD

## 2017-12-29 DIAGNOSIS — Z9861 Coronary angioplasty status: Secondary | ICD-10-CM | POA: Diagnosis not present

## 2017-12-29 DIAGNOSIS — I2510 Atherosclerotic heart disease of native coronary artery without angina pectoris: Secondary | ICD-10-CM

## 2017-12-29 LAB — EXERCISE TOLERANCE TEST
CHL CUP MPHR: 165 {beats}/min
CHL CUP RESTING HR STRESS: 75 {beats}/min
CHL RATE OF PERCEIVED EXERTION: 17
CSEPEDS: 0 s
CSEPEW: 10.4 METS
Exercise duration (min): 9 min
Peak HR: 153 {beats}/min
Percent HR: 92 %

## 2018-02-04 LAB — COMPREHENSIVE METABOLIC PANEL
A/G RATIO: 2.2 (ref 1.2–2.2)
ALBUMIN: 4.2 g/dL (ref 3.5–5.5)
ALT: 27 IU/L (ref 0–44)
AST: 19 IU/L (ref 0–40)
Alkaline Phosphatase: 67 IU/L (ref 39–117)
BILIRUBIN TOTAL: 0.7 mg/dL (ref 0.0–1.2)
BUN / CREAT RATIO: 17 (ref 9–20)
BUN: 17 mg/dL (ref 6–24)
CHLORIDE: 107 mmol/L — AB (ref 96–106)
CO2: 20 mmol/L (ref 20–29)
Calcium: 9 mg/dL (ref 8.7–10.2)
Creatinine, Ser: 1.03 mg/dL (ref 0.76–1.27)
GFR calc non Af Amer: 81 mL/min/{1.73_m2} (ref >59)
GFR, EST AFRICAN AMERICAN: 94 mL/min/{1.73_m2} (ref >59)
GLOBULIN, TOTAL: 1.9 g/dL (ref 1.5–4.5)
Glucose: 93 mg/dL (ref 65–99)
POTASSIUM: 4.4 mmol/L (ref 3.5–5.2)
Sodium: 143 mmol/L (ref 134–144)
TOTAL PROTEIN: 6.1 g/dL (ref 6.0–8.5)

## 2018-02-04 LAB — LIPOPROTEIN A (LPA): LIPOPROTEIN (A): 10.8 nmol/L (ref <75.0)

## 2018-02-04 LAB — LIPID PANEL
CHOL/HDL RATIO: 2.1 ratio (ref 0.0–5.0)
Cholesterol, Total: 77 mg/dL — ABNORMAL LOW (ref 100–199)
HDL: 37 mg/dL — ABNORMAL LOW (ref >39)
LDL Calculated: 24 mg/dL (ref 0–99)
Triglycerides: 79 mg/dL (ref 0–149)
VLDL Cholesterol Cal: 16 mg/dL (ref 5–40)

## 2018-03-02 ENCOUNTER — Ambulatory Visit: Payer: BLUE CROSS/BLUE SHIELD | Admitting: Cardiology

## 2018-03-11 ENCOUNTER — Ambulatory Visit: Payer: BLUE CROSS/BLUE SHIELD | Admitting: Cardiology

## 2018-03-11 ENCOUNTER — Encounter: Payer: Self-pay | Admitting: Cardiology

## 2018-03-11 VITALS — BP 126/76 | HR 68 | Ht 71.0 in | Wt 234.8 lb

## 2018-03-11 DIAGNOSIS — E785 Hyperlipidemia, unspecified: Secondary | ICD-10-CM

## 2018-03-11 DIAGNOSIS — I25118 Atherosclerotic heart disease of native coronary artery with other forms of angina pectoris: Secondary | ICD-10-CM

## 2018-03-11 DIAGNOSIS — I10 Essential (primary) hypertension: Secondary | ICD-10-CM

## 2018-03-11 MED ORDER — PITAVASTATIN CALCIUM 2 MG PO TABS: ORAL_TABLET | ORAL | 3 refills | Status: DC

## 2018-03-11 NOTE — Patient Instructions (Signed)
Medication Instructions:  Your physician has recommended you make the following change in your medication: RESTART Repatha, continue if no rash RESTART Privastatin 2 mg two days a week if no rash occurs in conjunction with with Repatha in a two week time frame.  If you need a refill on your cardiac medications before your next appointment, please call your pharmacy.   Lab work: Your physician recommends that you have a lipid panel and CMP drawn today.  If you have labs (blood work) drawn today and your tests are completely normal, you will receive your results only by: Marland Kitchen MyChart Message (if you have MyChart) OR . A paper copy in the mail If you have any lab test that is abnormal or we need to change your treatment, we will call you to review the results.  Testing/Procedures: An EKG was performed today.  Follow-Up: At [facility], you and your health needs are our priority.  As part of our continuing mission to provide you with exceptional heart care, we have created designated Provider Care Teams.  These Care Teams include your primary Cardiologist (physician) and Advanced Practice Providers (APPs -  Physician Assistants and Nurse Practitioners) who all work together to provide you with the care you need, when you need it. You will need a follow up appointment in 6 months.

## 2018-03-11 NOTE — Progress Notes (Signed)
Cardiology Office Note:    Date:  03/11/2018   ID:  Keith Hunter, DOB 20-Oct-1960, MRN [id number]  PCP:  Raina Mina., MD  Cardiologist:  Shirlee More, MD    Referring MD: Raina Mina., MD    ASSESSMENT:    1. Coronary artery disease of native artery of native heart with stable angina pectoris ([place])   2. Dyslipidemia   3. Essential hypertension    PLAN:    In order of problems listed above:  1. Stable CAD at this time continue medical therapy including clopidogrel resume lipid-lowering therapy with Repatha and if tolerated statin at this time I do not think he requires an ischemia evaluation. 2. Poorly controlled he continues Zetia but stopped his statin because of chronic back pain and discontinue Repatha as he had a vague rash.  We discussed the rationale for intensive lipid-lowering treatment recheck baseline lipids and liver function resume his Repatha and if tolerated in 2 weeks resume his statin Livalo. 3. Stable continue his ARB   Next appointment: 6 months   Medication Adjustments/Labs and Tests Ordered: Current medicines are reviewed at length with the patient today.  Concerns regarding medicines are outlined above.  Orders Placed This Encounter  Procedures  . Comp Met (CMET)  . Lipid Profile  . EKG 12-Lead   Meds ordered this encounter  Medications  . Pitavastatin Calcium 2 MG TABS    Sig: Take 1 tablet (2 mg) by mouth daily on Monday, Wednesday, and Friday.    Dispense:  30 tablet    Refill:  3    No chief complaint on file.   History of Present Illness:    Keith Hunter is a 58 y.o. male with a hx of CAD with non-STEMI 08/05/16 PCI and 2 DES to RCA   last seen 11/29/17.  Unfortunately has been intolerant of multiple high intermediate and low intensity statins.  His lipid profile indicates familial hyperlipidemia and is initiated on PCSK9 therapy with a minimal dose of background statin Livalo.  His LP(a) level is normal and most recent lipid profile  performed 02/03/2018 total cholesterol 77 triglycerides 79 HDL 37 LDL 24.  He underwent a treadmill EKG 12/29/2017 with no evidence of ischemia to 10 minutes normal EKG response hypertensive blood pressure response no chest pain and no arrhythmia. Compliance with diet, lifestyle and medications: Yes  Overall he is done well he walks 30 to 40 minutes a day and has had no angina dyspnea palpitation or syncope and is pleased with the quality of his life.  In the last month he had a diffuse rash thought it could be due to [place] and he skipped his last dose he has a history of chronic low back pain and may been a little worse he decided to stop taking a statin but he is unimproved.  I doubt either of these 2 are due to lipid-lowering therapy will resume Repatha and if tolerated back on his statin.  On treatment his lipids are ideal.  He voiced understanding and importance for lipid-lowering therapy and I suspect he will comply. Past Medical History:  Diagnosis Date  . CAD (coronary artery disease), native coronary artery    a. Cath 08/05/16 - 99% pRCA & 50%mRCA s/p PTCA and  overlapping synergy drug-eluting stents  . Chest pain in adult 08/04/2016  . Mixed hyperlipidemia 04/24/2015   Last Assessment & Plan:  Relevant Hx: Course: Daily Update: Today's Plan:this is going to be updated today and will see how  he is with this  Electronically signed by: Mayer Camel, NP 04/25/15 1119  . NSTEMI (non-ST elevated myocardial infarction) ([place]) 08/05/2016  . Primary osteoarthritis involving multiple joints 04/25/2015   Last Assessment & Plan:  Relevant Hx: Course: Daily Update: Today's Plan:discussed this in depth with him and he is using some OTC meds for this when he golfs and he has tried glucosamine which did not seem to help him much at all  Electronically signed by: Mayer Camel, NP 04/25/15 1118  . Snoring 04/25/2015   Last Assessment & Plan:  Relevant Hx: Course: Daily Update: Today's  Plan:long discussion with him about this and he is advised that he should have sleep study which he does not want to and is going to investigate having mouth piece  Electronically signed by: Mayer Camel, NP 04/25/15 1122    Past Surgical History:  Procedure Laterality Date  . CORONARY ANGIOPLASTY WITH STENT PLACEMENT  08/05/2016  . CORONARY STENT INTERVENTION N/A 08/05/2016   Procedure: Coronary Stent Intervention;  Surgeon: Lorretta Harp, MD;  Location: [place] [facility];  Service: Cardiovascular;  Laterality: N/A;  . LEFT HEART CATH AND CORONARY ANGIOGRAPHY N/A 08/05/2016   Procedure: Left Heart Cath and Coronary Angiography;  Surgeon: Lorretta Harp, MD;  Location: [place] [facility] [facility];  Service: Cardiovascular;  Laterality: N/A;  . SHOULDER OPEN ROTATOR CUFF REPAIR Right 1981    Current Medications: Current Meds  Medication Sig  . clopidogrel (PLAVIX) 75 MG tablet Take 1 tablet (75 mg total) by mouth daily.  . Coenzyme Q10 (COQ10) 200 MG CAPS Take 200 mg by mouth daily.  Marland Kitchen ezetimibe (ZETIA) 10 MG tablet TAKE 1 TABLET BY MOUTH EVERY DAY  . Flaxseed, Linseed, (FLAXSEED OIL PO) Take 1 capsule by mouth daily.  . nitroGLYCERIN (NITROSTAT) 0.4 MG SL tablet Place 1 tablet (0.4 mg total) under the tongue every 5 (five) minutes x 3 doses as needed for chest pain.  . Omega-3 Fatty Acids (FISH OIL PO) Take 1 capsule by mouth 2 (two) times daily.      Allergies:   Statins   Social History   Socioeconomic History  . Marital status: Married    Spouse name: Not on file  . Number of children: Not on file  . Years of education: Not on file  . Highest education level: Not on file  Occupational History  . Not on file  Social Needs  . Financial resource strain: Not on file  . Food insecurity:    Worry: Not on file    Inability: Not on file  . Transportation needs:    Medical: Not on file    Non-medical: Not on file  Tobacco Use  . Smoking status: Never Smoker  .  Smokeless tobacco: Never Used  Substance and Sexual Activity  . Alcohol use: No  . Drug use: No  . Sexual activity: Yes  Lifestyle  . Physical activity:    Days per week: Not on file    Minutes per session: Not on file  . Stress: Not on file  Relationships  . Social connections:    Talks on phone: Not on file    Gets together: Not on file    Attends religious service: Not on file    Active member of club or organization: Not on file    Attends meetings of clubs or organizations: Not on file    Relationship status: Not on file  Other Topics Concern  . Not  on file  Social History Narrative  . Not on file     Family History: The patient's family history includes Heart disease in his brother and father. ROS:   Please see the history of present illness.    All other systems reviewed and are negative.  EKGs/Labs/Other Studies Reviewed:    The following studies were reviewed today:  EKG:  EKG ordered today.  The ekg ordered today demonstrates [place] normal  Recent Labs: 02/03/2018: ALT 27; BUN 17; Creatinine, Ser 1.03; Potassium 4.4; Sodium 143  Recent Lipid Panel    Component Value Date/Time   CHOL 77 (L) 02/03/2018 1016   TRIG 79 02/03/2018 1016   HDL 37 (L) 02/03/2018 1016   CHOLHDL 2.1 02/03/2018 1016   LDLCALC 24 02/03/2018 1016    Physical Exam:    VS:  BP 126/76 (BP Location: Left Arm, Patient Position: Sitting, Cuff Size: Normal)   Pulse 68   Ht 5' 11" (1.803 m)   Wt 234 lb 12.8 oz (106.5 kg)   SpO2 98%   BMI 32.75 kg/m     Wt Readings from Last 3 Encounters:  03/11/18 234 lb 12.8 oz (106.5 kg)  11/29/17 239 lb 3.2 oz (108.5 kg)  08/03/17 232 lb (105.2 kg)     GEN:  Well nourished, well developed in no acute distress HEENT: Normal NECK: No JVD; No carotid bruits LYMPHATICS: No lymphadenopathy CARDIAC: RRR, no murmurs, rubs, gallops RESPIRATORY:  Clear to auscultation without rales, wheezing or rhonchi  ABDOMEN: Soft, non-tender,  non-distended MUSCULOSKELETAL:  No edema; No deformity  SKIN: Warm and dry NEUROLOGIC:  Alert and oriented x 3 PSYCHIATRIC:  Normal affect    Signed, Shirlee More, MD  03/11/2018 4:48 PM    [facility]

## 2018-03-12 LAB — COMPREHENSIVE METABOLIC PANEL
ALT: 22 IU/L (ref 0–44)
AST: 19 IU/L (ref 0–40)
Albumin/Globulin Ratio: 2.2 (ref 1.2–2.2)
Albumin: 4.4 g/dL (ref 3.8–4.9)
Alkaline Phosphatase: 67 IU/L (ref 39–117)
BUN/Creatinine Ratio: 18 (ref 9–20)
BUN: 15 mg/dL (ref 6–24)
Bilirubin Total: 0.7 mg/dL (ref 0.0–1.2)
CO2: 23 mmol/L (ref 20–29)
Calcium: 9.2 mg/dL (ref 8.7–10.2)
Chloride: 102 mmol/L (ref 96–106)
Creatinine, Ser: 0.85 mg/dL (ref 0.76–1.27)
GFR calc Af Amer: 113 mL/min/{1.73_m2} (ref >59)
GFR calc non Af Amer: 98 mL/min/{1.73_m2} (ref >59)
Globulin, Total: 2 g/dL (ref 1.5–4.5)
Glucose: 81 mg/dL (ref 65–99)
Potassium: 4.3 mmol/L (ref 3.5–5.2)
Sodium: 141 mmol/L (ref 134–144)
TOTAL PROTEIN: 6.4 g/dL (ref 6.0–8.5)

## 2018-03-12 LAB — LIPID PANEL
Chol/HDL Ratio: 4.1 ratio (ref 0.0–5.0)
Cholesterol, Total: 147 mg/dL (ref 100–199)
HDL: 36 mg/dL — AB (ref >39)
LDL Calculated: 94 mg/dL (ref 0–99)
Triglycerides: 86 mg/dL (ref 0–149)
VLDL CHOLESTEROL CAL: 17 mg/dL (ref 5–40)

## 2018-05-06 ENCOUNTER — Other Ambulatory Visit: Payer: Self-pay | Admitting: *Deleted

## 2018-05-06 DIAGNOSIS — E785 Hyperlipidemia, unspecified: Secondary | ICD-10-CM

## 2018-05-06 DIAGNOSIS — E7849 Other hyperlipidemia: Secondary | ICD-10-CM

## 2018-05-06 NOTE — Telephone Encounter (Signed)
Pt would like refill of Repatha but is not taking Pitavastatin, afraid it will make him break out in rash. Have resumed Repatha for 2 months with no rash, afraid to start Pitavastatin due to hx of rash. Pt also needs PA for the Repatha. Please advise. Got the care 1 year ago to get med for $5. Needs to renew this deal.

## 2018-05-09 MED ORDER — EVOLOCUMAB 140 MG/ML ~~LOC~~ SOAJ: 140.0000 mg | SUBCUTANEOUS | 3 refills | Status: DC

## 2018-05-09 NOTE — Addendum Note (Signed)
Addended by: Crist Fat on: 05/09/2018 11:11 AM   Modules accepted: Orders

## 2018-05-09 NOTE — Addendum Note (Signed)
Addended by: Crist Fat on: 05/09/2018 10:38 AM   Modules accepted: Orders

## 2018-05-09 NOTE — Telephone Encounter (Signed)
Patient informed to not restart pitavastatin at this time. Patient is agreeable to have a lipid panel redrawn this week as he restarted repatha the day of his last office visit with Dr. Dulce Sellar on 03/11/2018. Lipid panel order placed and patient advised to go to the [place] office for lab work, no appointment needed. He will fast beforehand. No further questions.

## 2018-05-09 NOTE — Telephone Encounter (Signed)
Patient was previously taking repatha and pitavastatin together and developed a rash. Patient stopped both cholesterol medications per Dr. Hulen Shouts instruction and has resumed repatha only as directed. Patient denies a rash or any other complications since restarting repatha. Patient wants to know if he should remain off pitavastatin or resume but take it only twice weekly. Will have Dr. Dulce Sellar advise.   Refill for [facility] sent [facility] in [place] as requested. Patient will be mailed a new repatha copay card. Patient informed that his prior authorization for repatha is effective from 12/14/2017-12/13/2018, so there is no need for a new prior authorization to be completed at this time. Patient verbalized understanding.

## 2018-05-09 NOTE — Telephone Encounter (Signed)
I would not restart a statin at this time, will check a lipid profile after he is on PCSK9 for 6 to 8 weeks

## 2018-05-13 LAB — LIPID PANEL
Chol/HDL Ratio: 2.3 ratio (ref 0.0–5.0)
Cholesterol, Total: 92 mg/dL — ABNORMAL LOW (ref 100–199)
HDL: 40 mg/dL (ref >39)
LDL Calculated: 35 mg/dL (ref 0–99)
Triglycerides: 85 mg/dL (ref 0–149)
VLDL Cholesterol Cal: 17 mg/dL (ref 5–40)

## 2018-05-24 DIAGNOSIS — E66812 Obesity, class 2: Secondary | ICD-10-CM | POA: Insufficient documentation

## 2018-05-24 DIAGNOSIS — Z6835 Body mass index (BMI) 35.0-35.9, adult: Secondary | ICD-10-CM

## 2018-05-24 HISTORY — DX: Body mass index (BMI) 35.0-35.9, adult: Z68.35

## 2018-08-04 ENCOUNTER — Other Ambulatory Visit: Payer: Self-pay | Admitting: Cardiovascular Disease

## 2018-08-18 ENCOUNTER — Other Ambulatory Visit: Payer: Self-pay | Admitting: Cardiology

## 2018-09-13 ENCOUNTER — Ambulatory Visit (INDEPENDENT_AMBULATORY_CARE_PROVIDER_SITE_OTHER): Payer: BC Managed Care – PPO | Admitting: Cardiology

## 2018-09-13 ENCOUNTER — Encounter: Payer: Self-pay | Admitting: Cardiology

## 2018-09-13 ENCOUNTER — Other Ambulatory Visit: Payer: Self-pay

## 2018-09-13 VITALS — BP 112/80 | HR 68 | Ht 71.0 in | Wt 220.6 lb

## 2018-09-13 DIAGNOSIS — I25118 Atherosclerotic heart disease of native coronary artery with other forms of angina pectoris: Secondary | ICD-10-CM

## 2018-09-13 DIAGNOSIS — G4733 Obstructive sleep apnea (adult) (pediatric): Secondary | ICD-10-CM

## 2018-09-13 DIAGNOSIS — E785 Hyperlipidemia, unspecified: Secondary | ICD-10-CM | POA: Diagnosis not present

## 2018-09-13 DIAGNOSIS — I10 Essential (primary) hypertension: Secondary | ICD-10-CM

## 2018-09-13 NOTE — Patient Instructions (Signed)
Medication Instructions:  Your physician recommends that you continue on your current medications as directed. Please refer to the Current Medication list given to you today.  If you need a refill on your cardiac medications before your next appointment, please call your pharmacy.   Lab work: Your physician recommends that you return for lab work today: CMP, lipid panel.   If you have labs (blood work) drawn today and your tests are completely normal, you will receive your results only by: . MyChart Message (if you have MyChart) OR . A paper copy in the mail If you have any lab test that is abnormal or we need to change your treatment, we will call you to review the results.  Testing/Procedures: None  Follow-Up: At [facility], you and your health needs are our priority.  As part of our continuing mission to provide you with exceptional heart care, we have created designated Provider Care Teams.  These Care Teams include your primary Cardiologist (physician) and Advanced Practice Providers (APPs -  Physician Assistants and Nurse Practitioners) who all work together to provide you with the care you need, when you need it. You will need a follow up appointment in 6 months.  Please call our office 2 months in advance to schedule this appointment.    

## 2018-09-13 NOTE — Addendum Note (Signed)
Addended by: Austin Miles on: 09/13/2018 02:57 PM   Modules accepted: Orders

## 2018-09-13 NOTE — Progress Notes (Signed)
Cardiology Office Note:    Date:  09/13/2018   ID:  Keith Baleimothy Earlywine, DOB 07/31/1960, MRN [id number]  PCP:  Gordan PaymentGrisso, Greg A., MD  Cardiologist:  Norman HerrlichBrian Symphonie Schneiderman, MD    Referring MD: Gordan PaymentGrisso, Greg A., MD    ASSESSMENT:    1. Coronary artery disease of native artery of native heart with stable angina pectoris (HCC)   2. Hyperlipidemia with target LDL less than 70   3. Essential hypertension   4. OSA (obstructive sleep apnea)    PLAN:    In order of problems listed above:  1. Stable [name] 2 years remote from PCI at this time continue medical treatment with single antiplatelet agent clopidogrel and lipid-lowering therapy Repatha. 2. Stable continue Repatha check liver function lipid profile today chronic back pain is unrelated 3. Stable BP at target continue ARB check renal function potassium 4. Stable   Next appointment: 6 months   Medication Adjustments/Labs and Tests Ordered: Current medicines are reviewed at length with the patient today.  Concerns regarding medicines are outlined above.  No orders of the defined types were placed in this encounter.  No orders of the defined types were placed in this encounter.   Chief Complaint  Patient presents with   Follow-up   Coronary Artery Disease    History of Present Illness:    Keith Hunter is a 58 y.o. male with a hx of CAD with non-STEMI 08/05/16 PCI and 2 DES to RCA   last seen 11/29/17.  Unfortunately has been intolerant of multiple high intermediate and low intensity statins.  His lipid profile indicates familial hyperlipidemia and is initiated on PCSK9 therapy with a minimal dose of background statin Livalo.  His LP(a) level is normal and most recent lipid profile performed 02/03/2018 total cholesterol 77 triglycerides 79 HDL 37 LDL 24.  He underwent a treadmill EKG 12/29/2017 with no evidence of ischemia to 10 minutes normal EKG response hypertensive blood pressure response no chest pain and no  arrhythmia.  He was last seen 03/11/2018. Compliance with diet, lifestyle and medications: Yes  He is having chronic back pain unrelated to Repatha and does not tolerate either Zetia or statins.  Despite that his lipids are ideal we will recheck today.  No angina dyspnea palpitation or syncope and remains active.  He is now 2 years remote from PCI. Past Medical History:  Diagnosis Date   CAD (coronary artery disease), native coronary artery    a. Cath 08/05/16 - 99% pRCA & 50%mRCA s/p PTCA and  overlapping synergy drug-eluting stents   Chest pain in adult 08/04/2016   Mixed hyperlipidemia 04/24/2015   Last Assessment & Plan:  Relevant Hx: Course: Daily Update: Today's Plan:this is going to be updated today and will see how he is with this  Electronically signed by: Krystal ClarkMelissa Joyce Brown-Patram, NP 04/25/15 1119   NSTEMI (non-ST elevated myocardial infarction) (HCC) 08/05/2016   Primary osteoarthritis involving multiple joints 04/25/2015   Last Assessment & Plan:  Relevant Hx: Course: Daily Update: Today's Plan:discussed this in depth with him and he is using some OTC meds for this when he golfs and he has tried glucosamine which did not seem to help him much at all  Electronically signed by: Krystal ClarkMelissa Joyce Brown-Patram, NP 04/25/15 1118   Snoring 04/25/2015   Last Assessment & Plan:  Relevant Hx: Course: Daily Update: Today's Plan:long discussion with him about this and he is advised that he should have sleep study which he does  not want to and is going to investigate having mouth piece  Electronically signed by: Mayer Camel, NP 04/25/15 1122    Past Surgical History:  Procedure Laterality Date   CORONARY ANGIOPLASTY WITH STENT PLACEMENT  08/05/2016   CORONARY STENT INTERVENTION N/A 08/05/2016   Procedure: Coronary Stent Intervention;  Surgeon: Lorretta Harp, MD;  Location: [place] [facility];  Service: Cardiovascular;  Laterality: N/A;   LEFT HEART CATH AND CORONARY  ANGIOGRAPHY N/A 08/05/2016   Procedure: Left Heart Cath and Coronary Angiography;  Surgeon: Lorretta Harp, MD;  Location: [place] [facility];  Service: Cardiovascular;  Laterality: N/A;   SHOULDER OPEN ROTATOR CUFF REPAIR Right 1981    Current Medications: Current Meds  Medication Sig   clopidogrel (PLAVIX) 75 MG tablet TAKE 1 TABLET BY MOUTH EVERY DAY   Coenzyme Q10 (COQ10) 200 MG CAPS Take 200 mg by mouth daily.   Flaxseed, Linseed, (FLAXSEED OIL PO) Take 1 capsule by mouth daily.   losartan (COZAAR) 50 MG tablet Take 50 mg by mouth daily.   nitroGLYCERIN (NITROSTAT) 0.4 MG SL tablet Place 1 tablet (0.4 mg total) under the tongue every 5 (five) minutes x 3 doses as needed for chest pain.   Omega-3 Fatty Acids (FISH OIL PO) Take 1 capsule by mouth daily.    REPATHA SURECLICK 734 MG/ML SOAJ INJECT 140 MG INTO THE SKIN EVERY 14 (FOURTEEN) DAYS.     Allergies:   Statins   Social History   Socioeconomic History   Marital status: Married    Spouse name: Not on file   Number of children: Not on file   Years of education: Not on file   Highest education level: Not on file  Occupational History   Not on file  Social Needs   Financial resource strain: Not on file   Food insecurity    Worry: Not on file    Inability: Not on file   Transportation needs    Medical: Not on file    Non-medical: Not on file  Tobacco Use   Smoking status: Never Smoker   Smokeless tobacco: Never Used  Substance and Sexual Activity   Alcohol use: No   Drug use: No   Sexual activity: Yes  Lifestyle   Physical activity    Days per week: Not on file    Minutes per session: Not on file   Stress: Not on file  Relationships   Social connections    Talks on phone: Not on file    Gets together: Not on file    Attends religious service: Not on file    Active member of club or organization: Not on file    Attends meetings of clubs or organizations: Not on file    Relationship  status: Not on file  Other Topics Concern   Not on file  Social History Narrative   Not on file     Family History: The patient's family history includes Heart disease in his brother and father. ROS:   Please see the history of present illness.    All other systems reviewed and are negative.  EKGs/Labs/Other Studies Reviewed:    The following studies were reviewed today:   Recent Labs: 03/11/2018: ALT 22; BUN 15; Creatinine, Ser 0.85; Potassium 4.3; Sodium 141  Recent Lipid Panel    Component Value Date/Time   CHOL 92 (L) 05/12/2018 1101   TRIG 85 05/12/2018 1101   HDL 40 05/12/2018 1101   CHOLHDL 2.3 05/12/2018  1101   LDLCALC 35 05/12/2018 1101    Physical Exam:    VS:  BP 112/80 (BP Location: Right Arm, Patient Position: Sitting, Cuff Size: Large)    Pulse 68    Ht 5\' 11"  (1.803 m)    Wt 220 lb 9.6 oz (100.1 kg)    SpO2 97%    BMI 30.77 kg/m     Wt Readings from Last 3 Encounters:  09/13/18 220 lb 9.6 oz (100.1 kg)  03/11/18 234 lb 12.8 oz (106.5 kg)  11/29/17 239 lb 3.2 oz (108.5 kg)     GEN:   Well nourished, well developed in no acute distress HEENT: Normal NECK: No JVD; No carotid bruits LYMPHATICS: No lymphadenopathy CARDIAC:  RRR, no murmurs, rubs, gallops RESPIRATORY:  Clear to auscultation without rales, wheezing or rhonchi  ABDOMEN: Soft, non-tender, non-distended MUSCULOSKELETAL:  No edema; No deformity  SKIN: Warm and dry NEUROLOGIC:  Alert and oriented x 3 PSYCHIATRIC:  Normal affect    Signed, Norman Herrlich, MD  09/13/2018 2:41 PM    [facility]

## 2018-09-14 LAB — COMPREHENSIVE METABOLIC PANEL
ALT: 27 IU/L (ref 0–44)
AST: 25 IU/L (ref 0–40)
Albumin/Globulin Ratio: 2.8 — ABNORMAL HIGH (ref 1.2–2.2)
Albumin: 4.7 g/dL (ref 3.8–4.9)
Alkaline Phosphatase: 74 IU/L (ref 39–117)
BUN/Creatinine Ratio: 15 (ref 9–20)
BUN: 14 mg/dL (ref 6–24)
Bilirubin Total: 0.8 mg/dL (ref 0.0–1.2)
CO2: 26 mmol/L (ref 20–29)
Calcium: 9.6 mg/dL (ref 8.7–10.2)
Chloride: 102 mmol/L (ref 96–106)
Creatinine, Ser: 0.93 mg/dL (ref 0.76–1.27)
GFR calc Af Amer: 106 mL/min/{1.73_m2} (ref >59)
GFR calc non Af Amer: 91 mL/min/{1.73_m2} (ref >59)
Globulin, Total: 1.7 g/dL (ref 1.5–4.5)
Glucose: 86 mg/dL (ref 65–99)
Potassium: 4.3 mmol/L (ref 3.5–5.2)
Sodium: 142 mmol/L (ref 134–144)
Total Protein: 6.4 g/dL (ref 6.0–8.5)

## 2018-09-14 LAB — LIPID PANEL
Chol/HDL Ratio: 2.5 ratio (ref 0.0–5.0)
Cholesterol, Total: 98 mg/dL — ABNORMAL LOW (ref 100–199)
HDL: 40 mg/dL (ref >39)
LDL Calculated: 29 mg/dL (ref 0–99)
Triglycerides: 147 mg/dL (ref 0–149)
VLDL Cholesterol Cal: 29 mg/dL (ref 5–40)

## 2018-11-05 ENCOUNTER — Other Ambulatory Visit: Payer: Self-pay | Admitting: Cardiology

## 2018-11-07 NOTE — Telephone Encounter (Signed)
Plavix refill sent to [facility] [address] [place]

## 2018-12-27 ENCOUNTER — Other Ambulatory Visit: Payer: Self-pay | Admitting: Cardiology

## 2018-12-27 MED ORDER — CLOPIDOGREL BISULFATE 75 MG PO TABS: 75.0000 mg | ORAL_TABLET | Freq: Every day | ORAL | 1 refills | Status: DC

## 2018-12-27 NOTE — Telephone Encounter (Signed)
°*  STAT* If patient is at the pharmacy, call can be transferred to refill team.   1. Which medications need to be refilled? (please list name of each medication and dose if known) clopidogrel (PLAVIX) 75 MG   2. Which pharmacy/location (including street and city if local pharmacy) is medication to be sent to?  [id number] [place], [address] [phone] (Phone) [phone] (Fax)    3. Do they need a 30 day or 90 day supply?

## 2018-12-29 ENCOUNTER — Other Ambulatory Visit: Payer: Self-pay | Admitting: Cardiology

## 2018-12-29 MED ORDER — REPATHA SURECLICK 140 MG/ML ~~LOC~~ SOAJ: 140.0000 mg | SUBCUTANEOUS | 12 refills | Status: DC

## 2018-12-29 NOTE — Telephone Encounter (Signed)
°*  STAT* If patient is at the pharmacy, call can be transferred to refill team.   1. Which medications need to be refilled? (please list name of each medication and dose if known) Repatha  2. Which pharmacy/location (including street and city if local pharmacy) is [facility] [address] [place]  3. Do they need a 30 day or 90 day supply? [place]

## 2019-01-04 ENCOUNTER — Telehealth: Payer: Self-pay | Admitting: Cardiology

## 2019-01-04 NOTE — Telephone Encounter (Signed)
His repatha needs prior [place]

## 2019-01-06 NOTE — Telephone Encounter (Signed)
Prior authorization for patient's repatha was completed on 01/02/2019 and this medication has been approved through 01/01/2020.

## 2019-03-21 DIAGNOSIS — M25551 Pain in right hip: Secondary | ICD-10-CM | POA: Insufficient documentation

## 2019-03-21 HISTORY — DX: Pain in right hip: M25.551

## 2019-03-22 DIAGNOSIS — M545 Low back pain, unspecified: Secondary | ICD-10-CM

## 2019-03-22 HISTORY — DX: Low back pain, unspecified: M54.50

## 2019-03-23 DIAGNOSIS — M51369 Other intervertebral disc degeneration, lumbar region without mention of lumbar back pain or lower extremity pain: Secondary | ICD-10-CM

## 2019-03-23 DIAGNOSIS — M419 Scoliosis, unspecified: Secondary | ICD-10-CM

## 2019-03-23 DIAGNOSIS — M48061 Spinal stenosis, lumbar region without neurogenic claudication: Secondary | ICD-10-CM

## 2019-03-23 DIAGNOSIS — M545 Low back pain, unspecified: Secondary | ICD-10-CM

## 2019-03-23 HISTORY — DX: Low back pain, unspecified: M54.50

## 2019-03-23 HISTORY — DX: Other intervertebral disc degeneration, lumbar region without mention of lumbar back pain or lower extremity pain: M51.369

## 2019-03-23 HISTORY — DX: Scoliosis, unspecified: M41.9

## 2019-03-23 HISTORY — DX: Spinal stenosis, lumbar region without neurogenic claudication: M48.061

## 2019-05-19 DIAGNOSIS — M48061 Spinal stenosis, lumbar region without neurogenic claudication: Secondary | ICD-10-CM | POA: Insufficient documentation

## 2019-09-01 ENCOUNTER — Other Ambulatory Visit: Payer: Self-pay

## 2019-09-01 MED ORDER — CLOPIDOGREL BISULFATE 75 MG PO TABS: 75.0000 mg | ORAL_TABLET | Freq: Every day | ORAL | 0 refills | Status: DC

## 2019-09-01 NOTE — Progress Notes (Signed)
Refill sent in per faxed request with message asking that the patient please make an appointment for any further refills.

## 2019-09-07 ENCOUNTER — Other Ambulatory Visit: Payer: Self-pay | Admitting: Cardiology

## 2019-09-07 MED ORDER — CLOPIDOGREL BISULFATE 75 MG PO TABS: 75.0000 mg | ORAL_TABLET | Freq: Every day | ORAL | 3 refills | Status: DC

## 2019-09-07 NOTE — Telephone Encounter (Signed)
New message   *STAT* If patient is at the pharmacy, call can be transferred to refill team.   1. Which medications need to be refilled? (please list name of each medication and dose if known) clopidogrel (PLAVIX) 75 MG tablet  2. Which pharmacy/location (including street and city if local pharmacy) is medication to be sent to? [id number] [place], [place] - [address]  3. Do they need a 30 day or 90 day supply? 90 day    Patient is out of medication and needs today.

## 2019-09-07 NOTE — Telephone Encounter (Signed)
Refill sent in per request.  

## 2019-11-03 ENCOUNTER — Ambulatory Visit: Payer: BC Managed Care – PPO | Admitting: Cardiology

## 2019-12-18 ENCOUNTER — Other Ambulatory Visit: Payer: Self-pay

## 2019-12-18 ENCOUNTER — Telehealth: Payer: Self-pay | Admitting: Cardiology

## 2019-12-18 ENCOUNTER — Encounter: Payer: Self-pay | Admitting: Cardiology

## 2019-12-18 ENCOUNTER — Ambulatory Visit (INDEPENDENT_AMBULATORY_CARE_PROVIDER_SITE_OTHER): Payer: PRIVATE HEALTH INSURANCE | Admitting: Cardiology

## 2019-12-18 VITALS — BP 129/83 | HR 75 | Ht 70.0 in | Wt 233.2 lb

## 2019-12-18 DIAGNOSIS — I25118 Atherosclerotic heart disease of native coronary artery with other forms of angina pectoris: Secondary | ICD-10-CM | POA: Diagnosis not present

## 2019-12-18 DIAGNOSIS — I10 Essential (primary) hypertension: Secondary | ICD-10-CM | POA: Diagnosis not present

## 2019-12-18 DIAGNOSIS — E7849 Other hyperlipidemia: Secondary | ICD-10-CM

## 2019-12-18 NOTE — Telephone Encounter (Signed)
Patient has appt with Dr. Dulce Sellar today. He says he needs to have lab work done but I don't see any orders. He has been fasting so far and wants to know if blood work is needed and if so, will there be someone at the office to do it after his appt.

## 2019-12-18 NOTE — Telephone Encounter (Signed)
Spoke to the patient just now and let him know these recommendations. He verbalizes understanding and thanks me for the call back.  

## 2019-12-18 NOTE — Progress Notes (Signed)
Cardiology Office Note:    Date:  12/18/2019   ID:  [name], DOB 03-27-1960, MRN [id number]  PCP:  Gordan Payment., MD  Cardiologist:  Norman Herrlich, MD    Referring MD: Gordan Payment., MD    ASSESSMENT:    1. Coronary artery disease of native artery of native heart with stable angina pectoris (HCC)   2. Familial hyperlipidemia   3. Essential hypertension    PLAN:    In order of problems listed above:  1. Stable CAD continue PCSK9 inhibitor he has familial hyperlipidemia check labs if LDL is greater than 100 require a second agent Zetia or Colestid.  He is having no angina on current treatment this time I do not think he needs an ischemia evaluation. 2. Continue current treatment he is fasting if second agent is needed Colestid or Zetia 3. BP at target continue ARB strongly encouraged him to achieve weight loss and lifestyle change   Next appointment: 6 months   Medication Adjustments/Labs and Tests Ordered: Current medicines are reviewed at length with the patient today.  Concerns regarding medicines are outlined above.  Orders Placed This Encounter  Procedures  . EKG 12-Lead   No orders of the defined types were placed in this encounter.   Chief Complaint  Patient presents with  . Follow-up  . Coronary Artery Disease    History of Present Illness:    Keith Hunter is a 59 y.o. male with a hx of  CAD with non-STEMI 08/05/16 PCI and 2 DES to RCA  last seen 09/13/2018. Compliance with diet, lifestyle and medications: Yes  He is done well he tolerates Repatha and is unable to take any dose of any statin with incapacitating muscle pain and weakness.  We will recheck his labs today if a second agent is needed with the use Colestid or Zetia.  No angina dyspnea palpitation tolerates clopidogrel long-term without bleeding complication. Past Medical History:  Diagnosis Date  . CAD (coronary artery disease), native coronary artery    a. Cath 08/05/16 - 99% pRCA &  50%mRCA s/p PTCA and  overlapping synergy drug-eluting stents  . CAD S/P percutaneous coronary angioplasty 08/06/2016   RCA PCI with DES 08/06/16  . Chest pain in adult 08/04/2016  . Dyslipidemia 08/21/2016   Reportedly LDL 128 at PCP's office. Pt discharged on high dose statin Rx  . Essential hypertension 10/23/2016   Essential hypertension  . Familial hyperlipidemia 12/14/2017  . Family history of coronary artery disease 08/21/2016   Both father and brother with documented CAD  . Mixed hyperlipidemia 04/24/2015   Last Assessment & Plan:  Relevant Hx: Course: Daily Update: Today's Plan:this is going to be updated today and will see how he is with this  Electronically signed by: Krystal Clark, NP 04/25/15 1119  . Non-ST elevation (NSTEMI) myocardial infarction (HCC) 08/06/2016   Pt admitted 08/05/16 with NSTEMI-peak Troponin 0.083  . NSTEMI (non-ST elevated myocardial infarction) (HCC) 08/05/2016  . Primary osteoarthritis involving multiple joints 04/25/2015   Last Assessment & Plan:  Relevant Hx: Course: Daily Update: Today's Plan:discussed this in depth with him and he is using some OTC meds for this when he golfs and he has tried glucosamine which did not seem to help him much at all  Electronically signed by: Krystal Clark, NP 04/25/15 1118  . Snoring 04/25/2015   Last Assessment & Plan:  Relevant Hx: Course: Daily Update: Today's Plan:long discussion with him about this and he is advised that  he should have sleep study which he does not want to and is going to investigate having mouth piece  Electronically signed by: Krystal Clark, NP 04/25/15 1122    Past Surgical History:  Procedure Laterality Date  . CORONARY ANGIOPLASTY WITH STENT PLACEMENT  08/05/2016  . CORONARY STENT INTERVENTION N/A 08/05/2016   Procedure: Coronary Stent Intervention;  Surgeon: Runell Gess, MD;  Location: [facility];  Service: Cardiovascular;  Laterality: N/A;  . LEFT HEART  CATH AND CORONARY ANGIOGRAPHY N/A 08/05/2016   Procedure: Left Heart Cath and Coronary Angiography;  Surgeon: Runell Gess, MD;  Location: [facility];  Service: Cardiovascular;  Laterality: N/A;  . SHOULDER OPEN ROTATOR CUFF REPAIR Right 1981    Current Medications: Current Meds  Medication Sig  . clopidogrel (PLAVIX) 75 MG tablet Take 1 tablet (75 mg total) by mouth daily. Please schedule follow up visit for further refills  . Coenzyme Q10 (COQ10) 200 MG CAPS Take 200 mg by mouth daily.  . Evolocumab (REPATHA SURECLICK) 140 MG/ML SOAJ Inject 140 mg into the skin every 14 (fourteen) days.  . Flaxseed, Linseed, (FLAXSEED OIL PO) Take 1 capsule by mouth daily.  Marland Kitchen losartan (COZAAR) 50 MG tablet Take 50 mg by mouth daily.  . nitroGLYCERIN (NITROSTAT) 0.4 MG SL tablet Place 1 tablet (0.4 mg total) under the tongue every 5 (five) minutes x 3 doses as needed for chest pain.  . Omega-3 Fatty Acids (FISH OIL PO) Take 1 capsule by mouth daily.      Allergies:   Statins   Social History   Socioeconomic History  . Marital status: Married    Spouse name: Not on file  . Number of children: Not on file  . Years of education: Not on file  . Highest education level: Not on file  Occupational History  . Not on file  Tobacco Use  . Smoking status: Never Smoker  . Smokeless tobacco: Never Used  Vaping Use  . Vaping Use: Never used  Substance and Sexual Activity  . Alcohol use: No  . Drug use: No  . Sexual activity: Yes  Other Topics Concern  . Not on file  Social History Narrative  . Not on file   Social Determinants of Health   Financial Resource Strain:   . Difficulty of Paying Living Expenses: Not on file  Food Insecurity:   . Worried About Programme researcher, broadcasting/film/video in the Last Year: Not on file  . Ran Out of Food in the Last Year: Not on file  Transportation Needs:   . Lack of Transportation (Medical): Not on file  . Lack of Transportation (Non-Medical): Not on file    Physical Activity:   . Days of Exercise per Week: Not on file  . Minutes of Exercise per Session: Not on file  Stress:   . Feeling of Stress : Not on file  Social Connections:   . Frequency of Communication with Friends and Family: Not on file  . Frequency of Social Gatherings with Friends and Family: Not on file  . Attends Religious Services: Not on file  . Active Member of Clubs or Organizations: Not on file  . Attends Banker Meetings: Not on file  . Marital Status: Not on file     Family History: The patient's family history includes Heart disease in his brother and father. ROS:   Please see the history of present illness.    All other systems reviewed and  are negative.  EKGs/Labs/Other Studies Reviewed:    The following studies were reviewed today:  EKG:  EKG ordered today and personally reviewed.  The ekg ordered today demonstrates this rhythm and is normal  The LP(a) is low Ref Range & Units 1 yr ago   Lipoprotein (a) <75.0 nmol/L 10.8     Recent Labs: No results found for requested labs within last 8760 hours.  Recent Lipid Panel    Component Value Date/Time   CHOL 98 (L) 09/13/2018 1500   TRIG 147 09/13/2018 1500   HDL 40 09/13/2018 1500   CHOLHDL 2.5 09/13/2018 1500   LDLCALC 29 09/13/2018 1500    Physical Exam:    VS:  BP 129/83   Pulse 75   Ht 5\' 10"  (1.778 m)   Wt 233 lb 3.2 oz (105.8 kg)   SpO2 96%   BMI 33.46 kg/m     Wt Readings from Last 3 Encounters:  12/18/19 233 lb 3.2 oz (105.8 kg)  09/13/18 220 lb 9.6 oz (100.1 kg)  03/11/18 234 lb 12.8 oz (106.5 kg)     GEN:  Well nourished, well developed in no acute distress he has no xanthoma or xanthelasma HEENT: Normal NECK: No JVD; No carotid bruits LYMPHATICS: No lymphadenopathy CARDIAC: RRR, no murmurs, rubs, gallops RESPIRATORY:  Clear to auscultation without rales, wheezing or rhonchi  ABDOMEN: Soft, non-tender, non-distended MUSCULOSKELETAL:  No edema; No deformity   SKIN: Warm and dry NEUROLOGIC:  Alert and oriented x 3 PSYCHIATRIC:  Normal affect    Signed, 03/13/18, MD  12/18/2019 4:08 PM    [facility]

## 2019-12-18 NOTE — Patient Instructions (Signed)

## 2019-12-18 NOTE — Telephone Encounter (Signed)
Yes he needs labs done including lipids CMP  Yes we will have someone in the office to do labs.  Has been fasting continue the same

## 2019-12-19 ENCOUNTER — Telehealth: Payer: Self-pay

## 2019-12-19 LAB — COMPREHENSIVE METABOLIC PANEL
ALT: 35 IU/L (ref 0–44)
AST: 24 IU/L (ref 0–40)
Albumin/Globulin Ratio: 2.8 — ABNORMAL HIGH (ref 1.2–2.2)
Albumin: 4.4 g/dL (ref 3.8–4.9)
Alkaline Phosphatase: 81 IU/L (ref 44–121)
BUN/Creatinine Ratio: 15 (ref 9–20)
BUN: 12 mg/dL (ref 6–24)
Bilirubin Total: 0.8 mg/dL (ref 0.0–1.2)
CO2: 26 mmol/L (ref 20–29)
Calcium: 9.1 mg/dL (ref 8.7–10.2)
Chloride: 105 mmol/L (ref 96–106)
Creatinine, Ser: 0.78 mg/dL (ref 0.76–1.27)
GFR calc Af Amer: 116 mL/min/{1.73_m2} (ref >59)
GFR calc non Af Amer: 100 mL/min/{1.73_m2} (ref >59)
Globulin, Total: 1.6 g/dL (ref 1.5–4.5)
Glucose: 89 mg/dL (ref 65–99)
Potassium: 4 mmol/L (ref 3.5–5.2)
Sodium: 141 mmol/L (ref 134–144)
Total Protein: 6 g/dL (ref 6.0–8.5)

## 2019-12-19 LAB — LIPID PANEL
Chol/HDL Ratio: 3 ratio (ref 0.0–5.0)
Cholesterol, Total: 128 mg/dL (ref 100–199)
HDL: 43 mg/dL (ref >39)
LDL Chol Calc (NIH): 61 mg/dL (ref 0–99)
Triglycerides: 140 mg/dL (ref 0–149)
VLDL Cholesterol Cal: 24 mg/dL (ref 5–40)

## 2019-12-19 NOTE — Telephone Encounter (Signed)
Left message on patients voicemail to let him know that per Dr. Dulce Sellar:  Labs are great no changes    I also gave the patient our call back number to call back with any other questions or concerns.

## 2019-12-21 ENCOUNTER — Telehealth: Payer: Self-pay

## 2019-12-21 NOTE — Telephone Encounter (Signed)
Prior Auth for Repatha completed, determination pending. Conf# [id number]

## 2020-01-16 ENCOUNTER — Other Ambulatory Visit: Payer: Self-pay | Admitting: Cardiology

## 2020-01-16 NOTE — Telephone Encounter (Signed)
Refill sent to pharmacy.   

## 2020-01-23 ENCOUNTER — Telehealth: Payer: Self-pay

## 2020-01-23 NOTE — Telephone Encounter (Signed)
Per [employer] has cancelled PA for Repatha

## 2020-01-24 ENCOUNTER — Telehealth: Payer: Self-pay

## 2020-01-24 NOTE — Telephone Encounter (Signed)
Resubmitted PA with Rx benefits with urgent request. Decision is still pending. Confirmation #Prior Auth (EOC) ID: [id number]

## 2020-01-25 ENCOUNTER — Telehealth: Payer: Self-pay

## 2020-01-25 NOTE — Telephone Encounter (Signed)
Repatha Prior Auth approved til 01/24/2021 confirmation#[id number]

## 2020-02-12 DIAGNOSIS — Z8616 Personal history of COVID-19: Secondary | ICD-10-CM

## 2020-02-12 HISTORY — DX: Personal history of COVID-19: Z86.16

## 2020-06-18 ENCOUNTER — Ambulatory Visit: Payer: Self-pay | Admitting: Cardiology

## 2020-08-08 ENCOUNTER — Ambulatory Visit: Payer: No Typology Code available for payment source | Admitting: Cardiology

## 2020-08-08 ENCOUNTER — Other Ambulatory Visit: Payer: Self-pay

## 2020-08-08 ENCOUNTER — Encounter: Payer: Self-pay | Admitting: Cardiology

## 2020-08-08 VITALS — BP 110/74 | HR 84 | Ht 70.0 in | Wt 231.0 lb

## 2020-08-08 DIAGNOSIS — I10 Essential (primary) hypertension: Secondary | ICD-10-CM | POA: Diagnosis not present

## 2020-08-08 DIAGNOSIS — E785 Hyperlipidemia, unspecified: Secondary | ICD-10-CM

## 2020-08-08 DIAGNOSIS — E7849 Other hyperlipidemia: Secondary | ICD-10-CM | POA: Diagnosis not present

## 2020-08-08 DIAGNOSIS — I25118 Atherosclerotic heart disease of native coronary artery with other forms of angina pectoris: Secondary | ICD-10-CM

## 2020-08-08 DIAGNOSIS — Z789 Other specified health status: Secondary | ICD-10-CM

## 2020-08-08 NOTE — Progress Notes (Signed)
Cardiology Office Note:    Date:  08/08/2020   ID:  Keith Hunter, DOB 01/03/1961, MRN [id number]  PCP:  Gordan Payment., MD  Cardiologist:  Norman Herrlich, MD    Referring MD: Gordan Payment., MD    ASSESSMENT:    1. Coronary artery disease of native artery of native heart with stable angina pectoris (HCC)   2. Hyperlipidemia with target LDL less than 70   3. Familial hyperlipidemia   4. Essential hypertension   5. Statin intolerance    PLAN:    In order of problems listed above:  Keith Hunter continues to do well after percutaneous intervention no anginal discomfort Keith Hunter class I continue clopidogrel and lipid-lowering with Repatha and fish oil and his antihypertensive agent. Recheck lipid profile statin intolerant BP at target continue ARB check renal function potassium   Next appointment: 1 year   Medication Adjustments/Labs and Tests Ordered: Current medicines are reviewed at length with the patient today.  Concerns regarding medicines are outlined above.  No orders of the defined types were placed in this encounter.  No orders of the defined types were placed in this encounter.   Chief Complaint  Patient presents with   Follow-up   Coronary Artery Disease   Hyperlipidemia    History of Present Illness:    Keith Hunter is a 60 y.o. male with a hx of coronary artery disease and non-ST elevation MI July 2018 with PCI and 2 drug-eluting stents to the right coronary artery familial hyperlipidemia statin intolerance and hypertension last seen 12/08/2019.  Compliance with diet, lifestyle and medications: Yes  Overall doing well tolerates his PCSK9 but he continues to have back pain seeing orthopedic surgeons and shoulder pain and told me he will follow-up with him take Aleve in the morning and Tylenol in the afternoon.  He is having no muscle pain or weakness. No chest pain edema shortness of breath palpitation or syncope  Most recent labs  02/12/2020 potassium 4.4 sodium 141 creatinine 0.96 normal liver function test TSH normal 1.875 Past Medical History:  Diagnosis Date   CAD (coronary artery disease), native coronary artery    a. Cath 08/05/16 - 99% pRCA & 50%mRCA s/p PTCA and  overlapping synergy drug-eluting stents   CAD S/P percutaneous coronary angioplasty 08/06/2016   RCA PCI with DES 08/06/16   Chest pain in adult 08/04/2016   Dyslipidemia 08/21/2016   Reportedly LDL 128 at PCP's office. Pt discharged on high dose statin Rx   Essential hypertension 10/23/2016   Essential hypertension   Familial hyperlipidemia 12/14/2017   Family history of coronary artery disease 08/21/2016   Both father and brother with documented CAD   Mixed hyperlipidemia 04/24/2015   Last Assessment & Plan:  Relevant Hx: Course: Daily Update: Today's Plan:this is going to be updated today and will see how he is with this  Electronically signed by: Krystal Clark, NP 04/25/15 1119   Non-ST elevation (NSTEMI) myocardial infarction (HCC) 08/06/2016   Pt admitted 08/05/16 with NSTEMI-peak Troponin 0.083   NSTEMI (non-ST elevated myocardial infarction) (HCC) 08/05/2016   Primary osteoarthritis involving multiple joints 04/25/2015   Last Assessment & Plan:  Relevant Hx: Course: Daily Update: Today's Plan:discussed this in depth with him and he is using some OTC meds for this when he golfs and he has tried glucosamine which did not seem to help him much at all  Electronically signed by: Krystal Clark, NP 04/25/15 1118   Snoring 04/25/2015  Last Assessment & Plan:  Relevant Hx: Course: Daily Update: Today's Plan:long discussion with him about this and he is advised that he should have sleep study which he does not want to and is going to investigate having mouth piece  Electronically signed by: Krystal Clark, NP 04/25/15 1122    Past Surgical History:  Procedure Laterality Date   CORONARY ANGIOPLASTY WITH STENT PLACEMENT   08/05/2016   CORONARY STENT INTERVENTION N/A 08/05/2016   Procedure: Coronary Stent Intervention;  Surgeon: Runell Gess, MD;  Location: [facility] INVASIVE CV LAB;  Service: Cardiovascular;  Laterality: N/A;   LEFT HEART CATH AND CORONARY ANGIOGRAPHY N/A 08/05/2016   Procedure: Left Heart Cath and Coronary Angiography;  Surgeon: Runell Gess, MD;  Location: [facility] INVASIVE CV LAB;  Service: Cardiovascular;  Laterality: N/A;   SHOULDER OPEN ROTATOR CUFF REPAIR Right 1981    Current Medications: Current Meds  Medication Sig   clopidogrel (PLAVIX) 75 MG tablet Take 1 tablet (75 mg total) by mouth daily. Please schedule follow up visit for further refills   Coenzyme Q10 (COQ10) 200 MG CAPS Take 200 mg by mouth daily.   Flaxseed, Linseed, (FLAXSEED OIL PO) Take 1 capsule by mouth daily.   losartan (COZAAR) 50 MG tablet Take 50 mg by mouth daily.   nitroGLYCERIN (NITROSTAT) 0.4 MG SL tablet Place 1 tablet (0.4 mg total) under the tongue every 5 (five) minutes x 3 doses as needed for chest pain.   Omega-3 Fatty Acids (FISH OIL PO) Take 1 capsule by mouth daily.    REPATHA SURECLICK 140 MG/ML SOAJ INJECT 140 MG INTO THE SKIN EVERY 14 (FOURTEEN) DAYS.     Allergies:   Statins   Social History   Socioeconomic History   Marital status: Married    Spouse name: Not on file   Number of children: Not on file   Years of education: Not on file   Highest education level: Not on file  Occupational History   Not on file  Tobacco Use   Smoking status: Never   Smokeless tobacco: Never  Vaping Use   Vaping Use: Never used  Substance and Sexual Activity   Alcohol use: No   Drug use: No   Sexual activity: Yes  Other Topics Concern   Not on file  Social History Narrative   Not on file   Social Determinants of Health   Financial Resource Strain: Not on file  Food Insecurity: Not on file  Transportation Needs: Not on file  Physical Activity: Not on file  Stress: Not on file  Social Connections:  Not on file     Family History: The patient's family history includes Heart disease in his brother and father. ROS:   Please see the history of present illness.    All other systems reviewed and are negative.  EKGs/Labs/Other Studies Reviewed:    The following studies were reviewed today:   Recent Labs: 12/18/2019: ALT 35; BUN 12; Creatinine, Ser 0.78; Potassium 4.0; Sodium 141  Recent Lipid Panel    Component Value Date/Time   CHOL 128 12/18/2019 1612   TRIG 140 12/18/2019 1612   HDL 43 12/18/2019 1612   CHOLHDL 3.0 12/18/2019 1612   LDLCALC 61 12/18/2019 1612    Physical Exam:    VS:  BP 110/74 (BP Location: Left Arm, Patient Position: Sitting)   Pulse 84   Ht 5\' 10"  (1.778 m)   Wt 231 lb (104.8 kg)   SpO2 97%   BMI 33.15  kg/m     Wt Readings from Last 3 Encounters:  08/08/20 231 lb (104.8 kg)  12/18/19 233 lb 3.2 oz (105.8 kg)  09/13/18 220 lb 9.6 oz (100.1 kg)     GEN:  Well nourished, well developed in no acute distress HEENT: Normal NECK: No JVD; No carotid bruits LYMPHATICS: No lymphadenopathy CARDIAC: RRR, no murmurs, rubs, gallops RESPIRATORY:  Clear to auscultation without rales, wheezing or rhonchi  ABDOMEN: Soft, non-tender, non-distended MUSCULOSKELETAL:  No edema; No deformity  SKIN: Warm and dry NEUROLOGIC:  Alert and oriented x 3 PSYCHIATRIC:  Normal affect    Signed, Norman Herrlich, MD  08/08/2020 1:42 PM    [facility]

## 2020-08-08 NOTE — Patient Instructions (Signed)

## 2020-08-08 NOTE — Addendum Note (Signed)
Addended by: Delorse Limber I on: 08/08/2020 01:47 PM   Modules accepted: Orders

## 2020-08-09 ENCOUNTER — Telehealth: Payer: Self-pay

## 2020-08-09 LAB — LIPID PANEL
Chol/HDL Ratio: 2.6 ratio (ref 0.0–5.0)
Cholesterol, Total: 98 mg/dL — ABNORMAL LOW (ref 100–199)
HDL: 38 mg/dL — ABNORMAL LOW (ref >39)
LDL Chol Calc (NIH): 36 mg/dL (ref 0–99)
Triglycerides: 140 mg/dL (ref 0–149)
VLDL Cholesterol Cal: 24 mg/dL (ref 5–40)

## 2020-08-09 LAB — COMPREHENSIVE METABOLIC PANEL
ALT: 27 IU/L (ref 0–44)
AST: 21 IU/L (ref 0–40)
Albumin/Globulin Ratio: 2.3 — ABNORMAL HIGH (ref 1.2–2.2)
Albumin: 4.4 g/dL (ref 3.8–4.9)
Alkaline Phosphatase: 81 IU/L (ref 44–121)
BUN/Creatinine Ratio: 22 — ABNORMAL HIGH (ref 9–20)
BUN: 19 mg/dL (ref 6–24)
Bilirubin Total: 0.5 mg/dL (ref 0.0–1.2)
CO2: 23 mmol/L (ref 20–29)
Calcium: 9 mg/dL (ref 8.7–10.2)
Chloride: 107 mmol/L — ABNORMAL HIGH (ref 96–106)
Creatinine, Ser: 0.87 mg/dL (ref 0.76–1.27)
Globulin, Total: 1.9 g/dL (ref 1.5–4.5)
Glucose: 88 mg/dL (ref 65–99)
Potassium: 4.3 mmol/L (ref 3.5–5.2)
Sodium: 144 mmol/L (ref 134–144)
Total Protein: 6.3 g/dL (ref 6.0–8.5)
eGFR: 100 mL/min/{1.73_m2} (ref >59)

## 2020-08-09 NOTE — Telephone Encounter (Signed)
Spoke with patient regarding results and recommendation.  Patient verbalizes understanding and is agreeable to plan of care. Advised patient to call back with any issues or concerns.  

## 2020-08-09 NOTE — Telephone Encounter (Signed)
-----   Message from Baldo Daub, MD sent at 08/09/2020 12:38 PM EDT ----- Normal or stable result  Very good result no changes

## 2020-08-28 ENCOUNTER — Other Ambulatory Visit: Payer: Self-pay | Admitting: Cardiology

## 2020-10-01 DIAGNOSIS — M25512 Pain in left shoulder: Secondary | ICD-10-CM

## 2020-10-01 HISTORY — DX: Pain in left shoulder: M25.512

## 2020-12-17 ENCOUNTER — Telehealth: Payer: Self-pay

## 2020-12-17 NOTE — Telephone Encounter (Signed)
Prior auth for Repathat to Covermymeds Key : [id number]

## 2021-04-05 ENCOUNTER — Other Ambulatory Visit: Payer: Self-pay | Admitting: Cardiology

## 2021-04-16 ENCOUNTER — Telehealth: Payer: Self-pay

## 2021-04-16 NOTE — Telephone Encounter (Signed)
Unable to process Prior Auth for Repatha through cover my meds. Requested questionnaire to be fax.   ?

## 2021-04-17 NOTE — Telephone Encounter (Signed)
I tried to completed PA for Xarelto again but was unable to process the request. I requested questionnaire to be fax.  ?

## 2021-04-18 NOTE — Telephone Encounter (Signed)
Letter received from [employer], instructions is to initiate PA through [email or web address]. I started this PA and after completing demographics, the system was unable to recognize the patient information. I tried [employer], [employer] but was put on hold for more than hour. Unable to process this request.  ?

## 2021-04-22 NOTE — Telephone Encounter (Signed)
Tried to submit another prior authorization for patients Repatha the response received  ?Message from Plan ?[employer] does not handle this review. Please visit [email or web address] to start a prior authorization or fax information to [phone]. Please include all supporting chart notes. You may contact [employer] at [phone] ?  ?Downloaded prior authorization form will complete and fax with required documents. ?

## 2021-04-22 NOTE — Telephone Encounter (Signed)
Medication prior authorization for Repatha form completed and faxed with requested documents this am ?

## 2021-04-22 NOTE — Telephone Encounter (Deleted)
Tried to submit another prior authorization for patients Xarelto the response received  ?Message from Plan ?[employer] does not handle this review. Please visit [email or web address] to start a prior authorization or fax information to [phone]. Please include all supporting chart notes. You may contact [employer] at [phone] ? ?Will send to [employer] as requested above ?

## 2021-04-24 ENCOUNTER — Telehealth: Payer: Self-pay

## 2021-04-24 NOTE — Telephone Encounter (Signed)
Rx Benefits approved Repatha 04-23-2021 through 04-23-2022 ?Copy of approval scanned to chart media. ? ?Patient was informed of approval ?

## 2021-08-05 ENCOUNTER — Other Ambulatory Visit: Payer: Self-pay | Admitting: Cardiology

## 2021-08-05 NOTE — Telephone Encounter (Signed)
1 pen sent. Last appointment was 07/2020.

## 2021-09-05 ENCOUNTER — Other Ambulatory Visit: Payer: Self-pay | Admitting: Cardiology

## 2021-09-08 ENCOUNTER — Other Ambulatory Visit: Payer: Self-pay | Admitting: Cardiology

## 2021-09-30 NOTE — Progress Notes (Unsigned)
Cardiology Office Note:    Date:  10/01/2021   ID:  Keith Hunter, DOB 05/07/1960, MRN [id number]  PCP:  Gordan Payment., MD  Cardiologist:  Norman Herrlich, MD    Referring MD: Gordan Payment., MD    ASSESSMENT:    1. Coronary artery disease of native artery of native heart with stable angina pectoris (HCC)   2. Hyperlipidemia with target LDL less than 70   3. Familial hyperlipidemia   4. Statin intolerance   5. Essential hypertension    PLAN:    In order of problems listed above:  Devontre continues to do well with CAD he is on good medical therapy including clopidogrel for antiplatelet lipid-lowering with Repatha his LDL and non-HDL cholesterol are exceptional and he is having no anginal discomfort.  I have asked him to get in the habit of checking his blood pressure with good technique record and bring into office visits and try to find activity each day Can continue Repatha well-controlled Continue his ARB he needs to be checking home blood pressure and recording   Next appointment: 1 year   Medication Adjustments/Labs and Tests Ordered: Current medicines are reviewed at length with the patient today.  Concerns regarding medicines are outlined above.  No orders of the defined types were placed in this encounter.  No orders of the defined types were placed in this encounter.   No chief complaint on file.   History of Present Illness:    Keith Hunter is a 61 y.o. male with a hx of coronary artery disease with non-ST elevation MI in July 2018 with PCI and 2 drug-eluting stents to the right coronary artery familial hyperlipidemia with statin intolerance and hypertension last seen 08/08/2020.  Compliance with diet, lifestyle and medications: Yes  He has a history of LS disc disease is having more problems request referral to neurosurgery He is thrilled with the results of his lipid-lowering therapy unfortunately only has 1 sibling who died of heart disease last year and  no children. Recent literature shows a loss of life of 16 years for unrecognized familial hyperlipidemia. He has had no angina shortness of breath palpitation or syncope He is limited in his activities because of back pain  Most recent lipid panel 02/04/2021 continues to be exceptional with a cholesterol 76 HDL 38 non-HDL cholesterol 38 LDL cholesterol 29.  Liver function test were normal potassium 4.5 creatinine 0.88 Past Medical History:  Diagnosis Date   CAD (coronary artery disease), native coronary artery    a. Cath 08/05/16 - 99% pRCA & 50%mRCA s/p PTCA and  overlapping synergy drug-eluting stents   CAD S/P percutaneous coronary angioplasty 08/06/2016   RCA PCI with DES 08/06/16   Chest pain in adult 08/04/2016   Dyslipidemia 08/21/2016   Reportedly LDL 128 at PCP's office. Pt discharged on high dose statin Rx   Essential hypertension 10/23/2016   Essential hypertension   Familial hyperlipidemia 12/14/2017   Family history of coronary artery disease 08/21/2016   Both father and brother with documented CAD   Mixed hyperlipidemia 04/24/2015   Last Assessment & Plan:  Relevant Hx: Course: Daily Update: Today's Plan:this is going to be updated today and will see how he is with this  Electronically signed by: Krystal Clark, NP 04/25/15 1119   Non-ST elevation (NSTEMI) myocardial infarction (HCC) 08/06/2016   Pt admitted 08/05/16 with NSTEMI-peak Troponin 0.083   NSTEMI (non-ST elevated myocardial infarction) (HCC) 08/05/2016   Primary osteoarthritis involving multiple joints 04/25/2015  Last Assessment & Plan:  Relevant Hx: Course: Daily Update: Today's Plan:discussed this in depth with him and he is using some OTC meds for this when he golfs and he has tried glucosamine which did not seem to help him much at all  Electronically signed by: Krystal Clark, NP 04/25/15 1118   Snoring 04/25/2015   Last Assessment & Plan:  Relevant Hx: Course: Daily Update: Today's Plan:long  discussion with him about this and he is advised that he should have sleep study which he does not want to and is going to investigate having mouth piece  Electronically signed by: Krystal Clark, NP 04/25/15 1122    Past Surgical History:  Procedure Laterality Date   CORONARY ANGIOPLASTY WITH STENT PLACEMENT  08/05/2016   CORONARY STENT INTERVENTION N/A 08/05/2016   Procedure: Coronary Stent Intervention;  Surgeon: Runell Gess, MD;  Location: [facility] INVASIVE CV LAB;  Service: Cardiovascular;  Laterality: N/A;   LEFT HEART CATH AND CORONARY ANGIOGRAPHY N/A 08/05/2016   Procedure: Left Heart Cath and Coronary Angiography;  Surgeon: Runell Gess, MD;  Location: [facility] INVASIVE CV LAB;  Service: Cardiovascular;  Laterality: N/A;   SHOULDER OPEN ROTATOR CUFF REPAIR Right 1981    Current Medications: Current Meds  Medication Sig   clopidogrel (PLAVIX) 75 MG tablet TAKE 1 TABLET (75 MG TOTAL) BY MOUTH DAILY. PLEASE SCHEDULE FOLLOW UP VISIT FOR FURTHER REFILLS   Coenzyme Q10 (COQ10) 200 MG CAPS Take 200 mg by mouth daily.   Evolocumab (REPATHA SURECLICK) 140 MG/ML SOAJ Inject 140 mg into the skin every 14 (fourteen) days.   Flaxseed, Linseed, (FLAXSEED OIL PO) Take 1 capsule by mouth daily.   losartan (COZAAR) 50 MG tablet Take 50 mg by mouth daily.   nitroGLYCERIN (NITROSTAT) 0.4 MG SL tablet Place 1 tablet (0.4 mg total) under the tongue every 5 (five) minutes x 3 doses as needed for chest pain.   Omega-3 Fatty Acids (FISH OIL PO) Take 1 capsule by mouth daily.      Allergies:   Statins   Social History   Socioeconomic History   Marital status: Married    Spouse name: Not on file   Number of children: Not on file   Years of education: Not on file   Highest education level: Not on file  Occupational History   Not on file  Tobacco Use   Smoking status: Never   Smokeless tobacco: Never  Vaping Use   Vaping Use: Never used  Substance and Sexual Activity   Alcohol use:  No   Drug use: No   Sexual activity: Yes  Other Topics Concern   Not on file  Social History Narrative   Not on file   Social Determinants of Health   Financial Resource Strain: Not on file  Food Insecurity: Not on file  Transportation Needs: Not on file  Physical Activity: Not on file  Stress: Not on file  Social Connections: Not on file     Family History: The patient's family history includes Heart disease in his brother and father. ROS:   Please see the history of present illness.    All other systems reviewed and are negative.  EKGs/Labs/Other Studies Reviewed:    The following studies were reviewed today:  EKG:  EKG ordered today and personally reviewed.  The ekg ordered today demonstrates sinus rhythm normal EKG  Recent Labs: No results found for requested labs within last 365 days.  Recent Lipid Panel    Component  Value Date/Time   CHOL 98 (L) 08/08/2020 1350   TRIG 140 08/08/2020 1350   HDL 38 (L) 08/08/2020 1350   CHOLHDL 2.6 08/08/2020 1350   LDLCALC 36 08/08/2020 1350    Physical Exam:    VS:  BP 132/80 (BP Location: Right Arm, Patient Position: Sitting)   Pulse 71   Ht 5\' 10"  (1.778 m)   Wt 235 lb 6.4 oz (106.8 kg)   SpO2 96%   BMI 33.78 kg/m     Wt Readings from Last 3 Encounters:  10/01/21 235 lb 6.4 oz (106.8 kg)  08/08/20 231 lb (104.8 kg)  12/18/19 233 lb 3.2 oz (105.8 kg)     GEN:  Well nourished, well developed in no acute distress he has no xanthoma or xanthelasma HEENT: Normal NECK: No JVD; No carotid bruits LYMPHATICS: No lymphadenopathy CARDIAC: RRR, no murmurs, rubs, gallops RESPIRATORY:  Clear to auscultation without rales, wheezing or rhonchi  ABDOMEN: Soft, non-tender, non-distended MUSCULOSKELETAL:  No edema; No deformity  SKIN: Warm and dry NEUROLOGIC:  Alert and oriented x 3 PSYCHIATRIC:  Normal affect    Signed, 12/20/19, MD  10/01/2021 8:20 AM    [facility]

## 2021-10-01 ENCOUNTER — Encounter: Payer: Self-pay | Admitting: Cardiology

## 2021-10-01 ENCOUNTER — Ambulatory Visit: Payer: No Typology Code available for payment source | Attending: Cardiology | Admitting: Cardiology

## 2021-10-01 VITALS — BP 132/80 | HR 71 | Ht 70.0 in | Wt 235.4 lb

## 2021-10-01 DIAGNOSIS — Z789 Other specified health status: Secondary | ICD-10-CM | POA: Diagnosis not present

## 2021-10-01 DIAGNOSIS — I10 Essential (primary) hypertension: Secondary | ICD-10-CM

## 2021-10-01 DIAGNOSIS — E785 Hyperlipidemia, unspecified: Secondary | ICD-10-CM | POA: Diagnosis not present

## 2021-10-01 DIAGNOSIS — I25118 Atherosclerotic heart disease of native coronary artery with other forms of angina pectoris: Secondary | ICD-10-CM

## 2021-10-01 DIAGNOSIS — E7849 Other hyperlipidemia: Secondary | ICD-10-CM

## 2021-10-01 MED ORDER — REPATHA SURECLICK 140 MG/ML ~~LOC~~ SOAJ: 140.0000 mg | SUBCUTANEOUS | 6 refills | Status: DC

## 2021-10-01 NOTE — Addendum Note (Signed)
Addended by: Roxanne Mins I on: 10/01/2021 08:59 AM   Modules accepted: Orders

## 2021-10-01 NOTE — Patient Instructions (Addendum)
Medication Instructions:  Your physician recommends that you continue on your current medications as directed. Please refer to the Current Medication list given to you today.  *If you need a refill on your cardiac medications before your next appointment, please call your pharmacy*   Lab Work: Your physician recommends that you return for lab work in:   Labs today: CMP, Lipid, LPa  If you have labs (blood work) drawn today and your tests are completely normal, you will receive your results only by: MyChart Message (if you have MyChart) OR A paper copy in the mail If you have any lab test that is abnormal or we need to change your treatment, we will call you to review the results.   Testing/Procedures: None   Follow-Up: At [facility], you and your health needs are our priority.  As part of our continuing mission to provide you with exceptional heart care, we have created designated Provider Care Teams.  These Care Teams include your primary Cardiologist (physician) and Advanced Practice Providers (APPs -  Physician Assistants and Nurse Practitioners) who all work together to provide you with the care you need, when you need it.  We recommend signing up for the patient portal called "MyChart".  Sign up information is provided on this After Visit Summary.  MyChart is used to connect with patients for Virtual Visits (Telemedicine).  Patients are able to view lab/test results, encounter notes, upcoming appointments, etc.  Non-urgent messages can be sent to your provider as well.   To learn more about what you can do with MyChart, go to [email or web address].    Your next appointment:   1 year(s)  The format for your next appointment:   In Person  Provider:   Norman Herrlich, MD    Other Instructions [employer] Box for air quality  Important Information About Sugar          [employer]  Tips to measure your blood pressure correctly  To determine whether you  have hypertension, a medical professional will take a blood pressure reading. How you prepare for the test, the position of your arm, and other factors can change a blood pressure reading by 10% or more. That could be enough to hide high blood pressure, start you on a drug you don't really need, or lead your doctor to incorrectly adjust your medications. National and international guidelines offer specific instructions for measuring blood pressure. If a doctor, nurse, or medical assistant isn't doing it right, don't hesitate to ask him or her to get with the guidelines. Here's what you can do to ensure a correct reading:  Don't drink a caffeinated beverage or smoke during the 30 minutes before the test.  Sit quietly for five minutes before the test begins.  During the measurement, sit in a chair with your feet on the floor and your arm supported so your elbow is at about heart level.  The inflatable part of the cuff should completely cover at least 80% of your upper arm, and the cuff should be placed on bare skin, not over a shirt.  Don't talk during the measurement.  Have your blood pressure measured twice, with a brief break in between. If the readings are different by 5 points or more, have it done a third time. There are times to break these rules. If you sometimes feel lightheaded when getting out of bed in the morning or when you stand after sitting, you should have your blood pressure checked while seated and  then while standing to see if it falls from one position to the next. Because blood pressure varies throughout the day, your doctor will rarely diagnose hypertension on the basis of a single reading. Instead, he or she will want to confirm the measurements on at least two occasions, usually within a few weeks of one another. The exception to this rule is if you have a blood pressure reading of 180/110 mm Hg or higher. A result this high usually calls for prompt treatment. It's also a good idea  to have your blood pressure measured in both arms at least once, since the reading in one arm (usually the right) may be higher than that in the left. A 2014 study in The American Journal of Medicine of nearly 3,400 people found average arm- to-arm differences in systolic blood pressure of about 5 points. The higher number should be used to make treatment decisions. In 2017, new guidelines from the [place], the [employer] of Cardiology, and nine other health organizations lowered the diagnosis of high blood pressure to 130/80 mm Hg or higher for all adults. The guidelines also redefined the various blood pressure categories to now include normal, elevated, Stage 1 hypertension, Stage 2 hypertension, and hypertensive crisis (see "Blood pressure categories"). Blood pressure categories  Blood pressure category SYSTOLIC (upper number)  DIASTOLIC (lower number)  Normal Less than 120 mm Hg and Less than 80 mm Hg  Elevated 120-129 mm Hg and Less than 80 mm Hg  High blood pressure: Stage 1 hypertension 130-139 mm Hg or 80-89 mm Hg  High blood pressure: Stage 2 hypertension 140 mm Hg or higher or 90 mm Hg or higher  Hypertensive crisis (consult your doctor immediately) Higher than 180 mm Hg and/or Higher than 120 mm Hg  Source: American Heart Association and American Stroke Association. For more on getting your blood pressure under control, buy Controlling Your Blood Pressure, a Special Health Report from [facility].

## 2021-10-02 LAB — LIPID PANEL
Chol/HDL Ratio: 2.9 ratio (ref 0.0–5.0)
Cholesterol, Total: 91 mg/dL — ABNORMAL LOW (ref 100–199)
HDL: 31 mg/dL — ABNORMAL LOW (ref >39)
LDL Chol Calc (NIH): 33 mg/dL (ref 0–99)
Triglycerides: 158 mg/dL — ABNORMAL HIGH (ref 0–149)
VLDL Cholesterol Cal: 27 mg/dL (ref 5–40)

## 2021-10-02 LAB — COMPREHENSIVE METABOLIC PANEL
ALT: 24 IU/L (ref 0–44)
AST: 19 IU/L (ref 0–40)
Albumin/Globulin Ratio: 2.2 (ref 1.2–2.2)
Albumin: 4.4 g/dL (ref 3.8–4.9)
Alkaline Phosphatase: 85 IU/L (ref 44–121)
BUN/Creatinine Ratio: 26 — ABNORMAL HIGH (ref 9–20)
BUN: 22 mg/dL (ref 6–24)
Bilirubin Total: 0.4 mg/dL (ref 0.0–1.2)
CO2: 23 mmol/L (ref 20–29)
Calcium: 9 mg/dL (ref 8.7–10.2)
Chloride: 105 mmol/L (ref 96–106)
Creatinine, Ser: 0.86 mg/dL (ref 0.76–1.27)
Globulin, Total: 2 g/dL (ref 1.5–4.5)
Glucose: 101 mg/dL — ABNORMAL HIGH (ref 70–99)
Potassium: 4.3 mmol/L (ref 3.5–5.2)
Sodium: 143 mmol/L (ref 134–144)
Total Protein: 6.4 g/dL (ref 6.0–8.5)
eGFR: 100 mL/min/{1.73_m2} (ref >59)

## 2021-10-02 LAB — LIPOPROTEIN A (LPA): Lipoprotein (a): 16.4 nmol/L (ref <75.0)

## 2021-10-04 ENCOUNTER — Other Ambulatory Visit: Payer: Self-pay | Admitting: Cardiology

## 2021-10-16 DIAGNOSIS — M65311 Trigger thumb, right thumb: Secondary | ICD-10-CM

## 2021-10-16 HISTORY — DX: Trigger thumb, right thumb: M65.311

## 2021-10-31 ENCOUNTER — Telehealth: Payer: Self-pay | Admitting: Cardiology

## 2021-10-31 NOTE — Telephone Encounter (Signed)
Patient is returning call to discuss lab results. 

## 2021-11-03 NOTE — Telephone Encounter (Signed)
Results reviewed with pt as per Dr. Munley's note.  Pt verbalized understanding and had no additional questions. Routed to PCP  

## 2021-12-05 ENCOUNTER — Telehealth: Payer: Self-pay | Admitting: Cardiology

## 2021-12-05 NOTE — Telephone Encounter (Signed)
Patient went to the urgent care and was told that he had bursitis in his hip and needed a shot of Kennalog. The provider saw that he was on Plavix and would not give him the shot until he was given instructions on what to do with the plavix so he could get the shot. Please advise.

## 2021-12-05 NOTE — Telephone Encounter (Signed)
New Message:    Patient says he needs to get a shot, could not tell me exactly the name of the shot. His concern was, did he need to stop his Plavix if he got the shot?

## 2021-12-05 NOTE — Telephone Encounter (Signed)
Called the patient and informed him of Dr. Hulen Shouts response below regarding being on Plavix and getting a Kenalog shot:  "Should be okay "  Patient was appreciative for the call and had no further questions at this time.

## 2022-02-09 DIAGNOSIS — T466X5A Adverse effect of antihyperlipidemic and antiarteriosclerotic drugs, initial encounter: Secondary | ICD-10-CM

## 2022-02-09 HISTORY — DX: Adverse effect of antihyperlipidemic and antiarteriosclerotic drugs, initial encounter: T46.6X5A

## 2022-02-20 ENCOUNTER — Telehealth: Payer: Self-pay

## 2022-02-20 NOTE — Telephone Encounter (Signed)
Prior authorization submitted w/ CMM for Repatha  [facility] (Key: [facility]) Rx #: [id number] Repatha SureClick 140MG /ML auto-injectors

## 2022-02-23 NOTE — Telephone Encounter (Signed)
Electronic appeal submitted with CMM   Your prior authorization request has been denied. COMPLETE E-APPEAL Your request for prior authorization was denied, but an Electronic Appeal is available for your patient. Complete the questions in the Appeal section at the bottom of this page to pursue the appeal. For assistance, contact our support team at [phone].  Message from plan: Request Reference Number: [id number]. REPATHA SURE INJ 140MG /ML is denied for not meeting the prior authorization requirement(s). Details of this decision are in the notice attached below or have been faxed to you.

## 2022-02-25 ENCOUNTER — Telehealth: Payer: Self-pay | Admitting: Cardiology

## 2022-02-25 NOTE — Telephone Encounter (Signed)
Pt c/o medication issue:  1. Name of Medication:  Evolocumab (REPATHA SURECLICK) 425 MG/ML SOAJ  2. How are you currently taking this medication (dosage and times per day)?   3. Are you having a reaction (difficulty breathing--STAT)?   4. What is your medication issue?   Patient states [facility] is requesting a PA for Repatha since his insurance changed. Patient would like a call back to discuss.

## 2022-02-26 NOTE — Telephone Encounter (Signed)
Keith Hunter (Keith Hunter) Rx #: [id number] Repatha SureClick 140MG /ML auto-injectors Form [employer] Electronic Prior Authorization Form (2017 NCPDP) Created 11 days ago Sent to Plan 6 days ago Patient made aware of Repatha approval (see below)  Plan Response 6 days ago Submit Clinical Questions 6 days ago Determination Unfavorable 6 days ago eAppeal Submitted 3 days ago eAppeal Determination Favorable 15 hours ago Message from Plan Request Reference Number: [id number]. REPATHA SURE INJ 140MG /ML is denied for not meeting the prior authorization requirement(s). Details of this decision are in the notice attached below or have been faxed to you. PAAppealSupportedResponse Appeal Case [id number] is overturned. For further questions, call [phone].

## 2022-06-25 ENCOUNTER — Other Ambulatory Visit: Payer: Self-pay | Admitting: Cardiology

## 2022-06-25 NOTE — Telephone Encounter (Signed)
Rx sent to pharmacy   

## 2022-10-06 ENCOUNTER — Other Ambulatory Visit (HOSPITAL_COMMUNITY): Payer: Self-pay

## 2022-10-06 ENCOUNTER — Other Ambulatory Visit: Payer: Self-pay | Admitting: Cardiology

## 2022-10-07 ENCOUNTER — Other Ambulatory Visit (HOSPITAL_COMMUNITY): Payer: Self-pay

## 2022-10-08 ENCOUNTER — Other Ambulatory Visit (HOSPITAL_COMMUNITY): Payer: Self-pay

## 2022-10-08 ENCOUNTER — Telehealth: Payer: Self-pay

## 2022-10-08 NOTE — Telephone Encounter (Signed)
Pharmacy Patient Advocate Encounter   Received notification from CoverMyMeds that prior authorization for REPATHA is required/requested.   Insurance verification completed.   The patient is insured through [facility] .   Per test claim: PA required; PA submitted to [facility] via CoverMyMeds Key/confirmation #/EOC [facility] Status is pending

## 2022-10-08 NOTE — Telephone Encounter (Signed)
Pharmacy Patient Advocate Encounter  Received notification from [facility] that Prior Authorization for REPATHA has been APPROVED from 10/08/22 to 10/08/23

## 2022-10-31 ENCOUNTER — Other Ambulatory Visit: Payer: Self-pay | Admitting: Cardiology

## 2022-11-04 ENCOUNTER — Other Ambulatory Visit: Payer: Self-pay | Admitting: Cardiology

## 2022-11-13 ENCOUNTER — Encounter: Payer: Self-pay | Admitting: Cardiology

## 2022-11-15 NOTE — Progress Notes (Unsigned)
Cardiology Office Note:    Date:  11/16/2022   ID:  Keith Hunter, DOB 1960-10-02, MRN [id number]  PCP:  Gordan Payment., MD  Cardiologist:  Norman Herrlich, MD    Referring MD: Gordan Payment., MD    ASSESSMENT:    1. Coronary artery disease of native artery of native heart with stable angina pectoris (HCC)   2. Familial hyperlipidemia   3. Essential hypertension    PLAN:    In order of problems listed above:  Morrissey continues to do well with medical therapy he will continue long-term clopidogrel along with his antihypertensive and PCSK9 inhibitor last LDL was less than 50 recheck today along with an LP(a) and I did ask him to have his parents screened I would not advise an ischemia evaluation at this time Continue his ARB will check renal function potassium   Next appointment: 1 year   Medication Adjustments/Labs and Tests Ordered: Current medicines are reviewed at length with the patient today.  Concerns regarding medicines are outlined above.  Orders Placed This Encounter  Procedures   EKG 12-Lead   No orders of the defined types were placed in this encounter.    History of Present Illness:    Keith Hunter is a 62 y.o. male with a hx of CAD with non-ST elevation MI July 2018 with PCI with 2 drug-eluting stents to the right coronary artery hypertension familial hyperlipidemia with statin intolerance normal LP(a) last seen 10/01/2021.  Compliance with diet, lifestyle and medications: Yes  Cardiology perspective doing well no cardiovascular symptoms edema shortness of breath chest pain palpitation or syncope and tolerates his PCSK9 inhibitor without side effects Plan to recheck labs today CMP lipid profile LP(a) He has no children no siblings but I did ask him to have his parents screened for the genetic disorder He has chronic back pain thinks he will need surgery in the future for spinal stenosis has trouble in a walking program but he has a stationary bike and will  get back to doing it building up to 20 minutes/day Past Medical History:  Diagnosis Date   CAD (coronary artery disease), native coronary artery    a. Cath 08/05/16 - 99% pRCA & 50%mRCA s/p PTCA and  overlapping synergy drug-eluting stents   CAD S/P percutaneous coronary angioplasty 08/06/2016   RCA PCI with DES 08/06/16   Chest pain in adult 08/04/2016   Chronic right shoulder pain 10/28/2017   Class 2 severe obesity due to excess calories with serious comorbidity and body mass index (BMI) of 35.0 to 35.9 in adult [facility]) 05/24/2018   Degeneration of lumbar intervertebral disc 03/23/2019   Degenerative lumbar spinal stenosis 03/23/2019   Dyslipidemia 08/21/2016   Reportedly LDL 128 at PCP's office. Pt discharged on high dose statin Rx   Essential hypertension 10/23/2016   Essential hypertension   Familial hyperlipidemia 12/14/2017   Family history of coronary artery disease 08/21/2016   Both father and brother with documented CAD   History of COVID-19 02/12/2020   01/2020     Low back pain 03/22/2019   Lumbar pain 03/23/2019   Mixed hyperlipidemia 04/24/2015   Last Assessment & Plan:  Relevant Hx: Course: Daily Update: Today's Plan:this is going to be updated today and will see how he is with this  Electronically signed by: Krystal Clark, NP 04/25/15 1119   Myalgia due to statin 02/09/2022   Non-ST elevation (NSTEMI) myocardial infarction (HCC) 08/06/2016   Pt admitted 08/05/16 with NSTEMI-peak Troponin 0.083  NSTEMI (non-ST elevated myocardial infarction) (HCC) 08/05/2016   Obstructive sleep apnea syndrome 04/25/2015   Last Assessment & Plan:    Relevant Hx:   Course:   Daily Update:   Today's Plan:long discussion with him about this and he is advised that he should have sleep study which he does not want to and is going to investigate having mouth piece       Electronically signed by: Krystal Clark, NP   04/25/15 1122     Pain in joint of left shoulder  10/01/2020   Pain in joint of right hip 03/21/2019   Primary osteoarthritis involving multiple joints 04/25/2015   Last Assessment & Plan:  Relevant Hx: Course: Daily Update: Today's Plan:discussed this in depth with him and he is using some OTC meds for this when he golfs and he has tried glucosamine which did not seem to help him much at all  Electronically signed by: Krystal Clark, NP 04/25/15 1118   S/P right knee arthroscopy 09/14/2017   Scoliosis deformity of spine 03/23/2019   Snoring 04/25/2015   Last Assessment & Plan:  Relevant Hx: Course: Daily Update: Today's Plan:long discussion with him about this and he is advised that he should have sleep study which he does not want to and is going to investigate having mouth piece  Electronically signed by: Krystal Clark, NP 04/25/15 1122   Trigger thumb of right hand 10/16/2021    Current Medications: Current Meds  Medication Sig   clopidogrel (PLAVIX) 75 MG tablet Take 1 tablet (75 mg total) by mouth daily. Patient needs appointment for further refills. 2nd attempt   Coenzyme Q10 (COQ10) 200 MG CAPS Take 200 mg by mouth daily.   Evolocumab (REPATHA SURECLICK) 140 MG/ML SOAJ INJECT 140 MG INTO THE SKIN EVERY 14 (FOURTEEN) DAYS.   Flaxseed, Linseed, (FLAXSEED OIL PO) Take 1 capsule by mouth daily.   losartan (COZAAR) 50 MG tablet Take 50 mg by mouth daily.   nitroGLYCERIN (NITROSTAT) 0.4 MG SL tablet Place 1 tablet (0.4 mg total) under the tongue every 5 (five) minutes x 3 doses as needed for chest pain.   Omega-3 Fatty Acids (FISH OIL PO) Take 1 capsule by mouth daily.       EKGs/Labs/Other Studies Reviewed:    The following studies were reviewed today:  EKG Interpretation Date/Time:  Monday November 16 2022 09:28:17 EDT Ventricular Rate:  64 PR Interval:  162 QRS Duration:  86 QT Interval:  384 QTC Calculation: 396 R Axis:   -11  Text Interpretation: Normal sinus rhythm Low voltage QRS Poor R wave  progression RSR prime Abnormal ECG When compared with ECG of 06-Aug-2016 06:52, Premature ventricular complexes are no longer Present Questionable change in initial forces of Anterolateral leads T wave inversion now evident in Inferior leads Confirmed by Norman Herrlich ([id number]) on 11/16/2022 9:47:50 AM     EKG Interpretation Date/Time:  Monday November 16 2022 09:28:17 EDT Ventricular Rate:  64 PR Interval:  162 QRS Duration:  86 QT Interval:  384 QTC Calculation: 396 R Axis:   -11  Text Interpretation: Normal sinus rhythm Low voltage QRS Poor R wave progression RSR prime Abnormal ECG When compared with ECG of 06-Aug-2016 06:52, Premature ventricular complexes are no longer Present Questionable change in initial forces of Anterolateral leads T wave inversion now evident in Inferior leads Confirmed by Norman Herrlich ([id number]) on 11/16/2022 9:47:50 AM   Recent Labs: No results found for requested labs within last 365 days.  Recent Lipid Panel    Component Value Date/Time   CHOL 91 (L) 10/01/2021 0908   TRIG 158 (H) 10/01/2021 0908   HDL 31 (L) 10/01/2021 0908   CHOLHDL 2.9 10/01/2021 0908   LDLCALC 33 10/01/2021 0908    Physical Exam:    VS:  BP 110/70 (BP Location: Left Arm, Patient Position: Sitting)   Pulse 64   Ht 5\' 10"  (1.778 m)   Wt 234 lb 3.2 oz (106.2 kg)   SpO2 93%   BMI 33.60 kg/m     Wt Readings from Last 3 Encounters:  11/16/22 234 lb 3.2 oz (106.2 kg)  10/01/21 235 lb 6.4 oz (106.8 kg)  08/08/20 231 lb (104.8 kg)     GEN:  Well nourished, well developed in no acute distress HEENT: Normal NECK: No JVD; No carotid bruits LYMPHATICS: No lymphadenopathy CARDIAC: RRR, no murmurs, rubs, gallops RESPIRATORY:  Clear to auscultation without rales, wheezing or rhonchi  ABDOMEN: Soft, non-tender, non-distended MUSCULOSKELETAL:  No edema; No deformity  SKIN: Warm and dry NEUROLOGIC:  Alert and oriented x 3 PSYCHIATRIC:  Normal affect    Signed, Norman Herrlich, MD   11/16/2022 10:02 AM    [facility]

## 2022-11-16 ENCOUNTER — Encounter: Payer: Self-pay | Admitting: Cardiology

## 2022-11-16 ENCOUNTER — Ambulatory Visit: Payer: Commercial Managed Care - PPO | Attending: Cardiology | Admitting: Cardiology

## 2022-11-16 VITALS — BP 110/70 | HR 64 | Ht 70.0 in | Wt 234.2 lb

## 2022-11-16 DIAGNOSIS — I25118 Atherosclerotic heart disease of native coronary artery with other forms of angina pectoris: Secondary | ICD-10-CM

## 2022-11-16 DIAGNOSIS — E7849 Other hyperlipidemia: Secondary | ICD-10-CM | POA: Diagnosis not present

## 2022-11-16 DIAGNOSIS — I10 Essential (primary) hypertension: Secondary | ICD-10-CM

## 2022-11-16 MED ORDER — REPATHA SURECLICK 140 MG/ML ~~LOC~~ SOAJ: 140.0000 mg | SUBCUTANEOUS | 2 refills | Status: DC

## 2022-11-16 NOTE — Patient Instructions (Signed)
Medication Instructions:  Your physician recommends that you continue on your current medications as directed. Please refer to the Current Medication list given to you today.  *If you need a refill on your cardiac medications before your next appointment, please call your pharmacy*   Lab Work: Your physician recommends that you return for lab work in:   Labs today: CMP, Lipids, LPa  If you have labs (blood work) drawn today and your tests are completely normal, you will receive your results only by: MyChart Message (if you have MyChart) OR A paper copy in the mail If you have any lab test that is abnormal or we need to change your treatment, we will call you to review the results.   Testing/Procedures: None   Follow-Up: At [facility], you and your health needs are our priority.  As part of our continuing mission to provide you with exceptional heart care, we have created designated Provider Care Teams.  These Care Teams include your primary Cardiologist (physician) and Advanced Practice Providers (APPs -  Physician Assistants and Nurse Practitioners) who all work together to provide you with the care you need, when you need it.  We recommend signing up for the patient portal called "MyChart".  Sign up information is provided on this After Visit Summary.  MyChart is used to connect with patients for Virtual Visits (Telemedicine).  Patients are able to view lab/test results, encounter notes, upcoming appointments, etc.  Non-urgent messages can be sent to your provider as well.   To learn more about what you can do with MyChart, go to [email or web address].    Your next appointment:   1 year(s)  Provider:   Norman Herrlich, MD    Other Instructions Have your parents checked for LPa

## 2022-11-17 ENCOUNTER — Other Ambulatory Visit: Payer: Self-pay | Admitting: Cardiology

## 2022-11-20 ENCOUNTER — Encounter: Payer: Self-pay | Admitting: Cardiology

## 2022-11-20 LAB — LIPOPROTEIN A (LPA): Lipoprotein (a): 15.4 nmol/L (ref <75.0)

## 2022-11-20 LAB — LIPID PANEL
Chol/HDL Ratio: 2.6 ratio (ref 0.0–5.0)
Cholesterol, Total: 92 mg/dL — ABNORMAL LOW (ref 100–199)
HDL: 35 mg/dL — ABNORMAL LOW (ref >39)
LDL Chol Calc (NIH): 42 mg/dL (ref 0–99)
Triglycerides: 68 mg/dL (ref 0–149)
VLDL Cholesterol Cal: 15 mg/dL (ref 5–40)

## 2022-11-20 LAB — COMPREHENSIVE METABOLIC PANEL
ALT: 23 [IU]/L (ref 0–44)
AST: 23 [IU]/L (ref 0–40)
Albumin: 4.3 g/dL (ref 3.8–4.9)
Alkaline Phosphatase: 76 [IU]/L (ref 44–121)
BUN/Creatinine Ratio: 18 (ref 10–24)
BUN: 17 mg/dL (ref 8–27)
Bilirubin Total: 0.9 mg/dL (ref 0.0–1.2)
CO2: 23 mmol/L (ref 20–29)
Calcium: 9.2 mg/dL (ref 8.6–10.2)
Chloride: 105 mmol/L (ref 96–106)
Creatinine, Ser: 0.96 mg/dL (ref 0.76–1.27)
Globulin, Total: 1.7 g/dL (ref 1.5–4.5)
Glucose: 92 mg/dL (ref 70–99)
Potassium: 4.4 mmol/L (ref 3.5–5.2)
Sodium: 141 mmol/L (ref 134–144)
Total Protein: 6 g/dL (ref 6.0–8.5)
eGFR: 90 mL/min/{1.73_m2} (ref >59)

## 2023-02-20 DIAGNOSIS — R7303 Prediabetes: Secondary | ICD-10-CM | POA: Insufficient documentation

## 2023-09-24 DIAGNOSIS — M5416 Radiculopathy, lumbar region: Secondary | ICD-10-CM | POA: Insufficient documentation

## 2023-09-24 DIAGNOSIS — Z955 Presence of coronary angioplasty implant and graft: Secondary | ICD-10-CM | POA: Insufficient documentation

## 2023-09-24 DIAGNOSIS — M4316 Spondylolisthesis, lumbar region: Secondary | ICD-10-CM | POA: Insufficient documentation

## 2023-09-27 ENCOUNTER — Telehealth: Payer: Self-pay

## 2023-09-27 ENCOUNTER — Telehealth: Payer: Self-pay | Admitting: *Deleted

## 2023-09-27 NOTE — Telephone Encounter (Signed)
   Pre-operative Risk Assessment    Patient Name: Keith Hunter  DOB: 08-13-1960 MRN: [id number]   Date of last office visit: 11/16/22 Date of next office visit: 11/18/23   Request for Surgical Clearance    Procedure:  Left lateral lumber 3- lumbar 4, lumbar 4- lumbar 5 interbody fusion with spinal EMG, SSEP monitoring/ Posterior lumbar 3- lumbar 4, lumbar 4- lumbar 5 minimally invasive decompression and percutaneous fusion with O-arm/ stealth nav  Date of Surgery:  Clearance 10/14/23                                Surgeon:  Dr. Deward MARLA Cramp Surgeon's Group or Practice Name: [facility]  Phone number:  [phone] Fax number:  [phone]   Type of Clearance Requested:   - Pharmacy:  Hold Clopidogrel  (Plavix ) for 7 days and use a baby aspirin  in place.   Type of Anesthesia:  Not Indicated   Additional requests/questions:    [name]   09/27/2023, 11:15 AM

## 2023-09-27 NOTE — Telephone Encounter (Signed)
 Pt has been scheduled tele preop appt 10/05/23. Med rec and consent are done.

## 2023-09-27 NOTE — Telephone Encounter (Signed)
 Dr. Monetta,  Keith Hunter 63 year old male is requesting preoperative cardiac evaluation for left lateral lumbar interbody fusion with spinal EMG, SSEP monitoring/posterior lumbar minimally invasive decompression.  He was last seen in clinic on 11/16/2022.  During that time he remained stable from a cardiac standpoint.  Follow-up was planned for 1 year.  His PMH includes coronary artery disease status post NSTEMI 7/18 with PCI and DES x 2 to his RCA, HTN, hyperlipidemia, and statin intolerance.  May his Plavix  be held for 7 days prior to his procedure.  Requesting office would like to use baby aspirin  in place of this.  Thank you for your help.  Please direct your response to [facility] preop pool.  [name]. [name] NP-C     09/27/2023, 12:03 PM [facility] [address] [place], [place] [address] Office [phone]

## 2023-09-27 NOTE — Telephone Encounter (Signed)
 Pt has been scheduled tele preop appt 10/05/23. Med rec and consent are done.      Patient Consent for Virtual Visit        Keith Hunter has provided verbal consent on 09/27/2023 for a virtual visit (video or telephone).   CONSENT FOR VIRTUAL VISIT FOR:  Keith Hunter  By participating in this virtual visit I agree to the following:  I hereby voluntarily request, consent and authorize [facility] and its employed or contracted physicians, physician assistants, nurse practitioners or other licensed health care professionals (the Practitioner), to provide me with telemedicine health care services (the "Services) as deemed necessary by the treating Practitioner. I acknowledge and consent to receive the Services by the Practitioner via telemedicine. I understand that the telemedicine visit will involve communicating with the Practitioner through live audiovisual communication technology and the disclosure of certain medical information by electronic transmission. I acknowledge that I have been given the opportunity to request an in-person assessment or other available alternative prior to the telemedicine visit and am voluntarily participating in the telemedicine visit.  I understand that I have the right to withhold or withdraw my consent to the use of telemedicine in the course of my care at any time, without affecting my right to future care or treatment, and that the Practitioner or I may terminate the telemedicine visit at any time. I understand that I have the right to inspect all information obtained and/or recorded in the course of the telemedicine visit and may receive copies of available information for a reasonable fee.  I understand that some of the potential risks of receiving the Services via telemedicine include:  Delay or interruption in medical evaluation due to technological equipment failure or disruption; Information transmitted may not be sufficient (e.g. poor  resolution of images) to allow for appropriate medical decision making by the Practitioner; and/or  In rare instances, security protocols could fail, causing a breach of personal health information.  Furthermore, I acknowledge that it is my responsibility to provide information about my medical history, conditions and care that is complete and accurate to the best of my ability. I acknowledge that Practitioner's advice, recommendations, and/or decision may be based on factors not within their control, such as incomplete or inaccurate data provided by me or distortions of diagnostic images or specimens that may result from electronic transmissions. I understand that the practice of medicine is not an exact science and that Practitioner makes no warranties or guarantees regarding treatment outcomes. I acknowledge that a copy of this consent can be made available to me via my patient portal [facility] MyChart), or I can request a printed copy by calling the office of [facility].    I understand that my insurance will be billed for this visit.   I have read or had this consent read to me. I understand the contents of this consent, which adequately explains the benefits and risks of the Services being provided via telemedicine.  I have been provided ample opportunity to ask questions regarding this consent and the Services and have had my questions answered to my satisfaction. I give my informed consent for the services to be provided through the use of telemedicine in my medical care

## 2023-09-27 NOTE — Telephone Encounter (Signed)
   Name: Keith Hunter  DOB: 08-13-1960  MRN: [id number]  Primary Cardiologist: Dorn Lesches, MD   Preoperative team, please contact this patient and set up a phone call appointment for further preoperative risk assessment. Please obtain consent and complete medication review. Thank you for your help.  I confirm that guidance regarding antiplatelet and oral anticoagulation therapy has been completed and, if necessary, noted below.  Dr. Monetta recommends holding Plavix  for 7 days prior to his procedure.  He did not recommend the use of baby aspirin  during held time.  Please resume Plavix  as soon as possible at the discretion of the surgeon.  I also confirmed the patient resides in the state of  . As per [facility] Board telemedicine laws, the patient must reside in the state in which the provider is licensed.   [name], NP 09/27/2023, 1:22 PM [facility]

## 2023-10-05 ENCOUNTER — Ambulatory Visit: Attending: Cardiology

## 2023-10-05 DIAGNOSIS — Z01810 Encounter for preprocedural cardiovascular examination: Secondary | ICD-10-CM | POA: Diagnosis not present

## 2023-10-05 NOTE — Progress Notes (Signed)
 Virtual Visit via Telephone Note   Because of Keith Hunter co-morbid illnesses, he is at least at moderate risk for complications without adequate follow up.  This format is felt to be most appropriate for this patient at this time.  Due to technical limitations with video connection (technology), today's appointment will be conducted as an audio only telehealth visit, and Keith Hunter verbally agreed to proceed in this manner.   All issues noted in this document were discussed and addressed.  No physical exam could be performed with this format.  Evaluation Performed:  Preoperative cardiovascular risk assessment _____________   Date:  10/05/2023   Patient ID:  Keith Hunter, DOB 1960/08/15, MRN [id number] Patient Location:  Home Provider location:   Office  Primary Care Provider:  Thurmond Cathlyn LABOR., MD Primary Cardiologist:  Dorn Lesches, MD  Chief Complaint / Patient Profile  63 y.o. y/o male with a h/o CAD with non-ST elevation MI in July 2018 with PCI with 2 DES to RCA, hypertension, familial hyperlipidemia with statin intolerance who is pending Left lateral lumber 3- lumbar 4, lumbar 4- lumbar 5 interbody fusion with spinal EMG, SSEP monitoring/ Posterior lumbar 3- lumbar 4, lumbar 4- lumbar 5 minimally invasive decompression and percutaneous fusion with O-arm/ stealth nav and presents today for telephonic preoperative cardiovascular risk assessment. History of Present Illness  Keith Hunter is a 63 y.o. male who presents via audio/video conferencing for a telehealth visit today.  Pt was last seen in cardiology clinic on 11/16/2022 by Dr. Monetta.  At that time Keith Hunter was doing well.  The patient is now pending procedure as outlined above. Since his last visit, he has remained stable from cardiac standpoint.Today he denies chest pain, shortness of breath, lower extremity edema, fatigue, palpitations, melena, hematuria, hemoptysis, diaphoresis, weakness, presyncope, syncope,  orthopnea, and PND.  He is able to achieve greater than 4 METS of activity. Past Medical History    Past Medical History:  Diagnosis Date   CAD (coronary artery disease), native coronary artery    a. Cath 08/05/16 - 99% pRCA & 50%mRCA s/p PTCA and  overlapping synergy drug-eluting stents   CAD S/P percutaneous coronary angioplasty 08/06/2016   RCA PCI with DES 08/06/16   Chest pain in adult 08/04/2016   Chronic right shoulder pain 10/28/2017   Class 2 severe obesity due to excess calories with serious comorbidity and body mass index (BMI) of 35.0 to 35.9 in adult [facility]) 05/24/2018   Degeneration of lumbar intervertebral disc 03/23/2019   Degenerative lumbar spinal stenosis 03/23/2019   Dyslipidemia 08/21/2016   Reportedly LDL 128 at PCP's office. Pt discharged on high dose statin Rx   Essential hypertension 10/23/2016   Essential hypertension   Familial hyperlipidemia 12/14/2017   Family history of coronary artery disease 08/21/2016   Both father and brother with documented CAD   History of COVID-19 02/12/2020   01/2020     Low back pain 03/22/2019   Lumbar pain 03/23/2019   Mixed hyperlipidemia 04/24/2015   Last Assessment & Plan:  Relevant Hx: Course: Daily Update: Today's Plan:this is going to be updated today and will see how he is with this  Electronically signed by: Eleanor Merlynn Lady, NP 04/25/15 1119   Myalgia due to statin 02/09/2022   Non-ST elevation (NSTEMI) myocardial infarction (HCC) 08/06/2016   Pt admitted 08/05/16 with NSTEMI-peak Troponin 0.083   NSTEMI (non-ST elevated myocardial infarction) (HCC) 08/05/2016   Obstructive sleep apnea syndrome 04/25/2015   Last Assessment & Plan:  Relevant Hx:   Course:   Daily Update:   Today's Plan:long discussion with him about this and he is advised that he should have sleep study which he does not want to and is going to investigate having mouth piece       Electronically signed by: Eleanor Merlynn Lady, NP    04/25/15 1122     Pain in joint of left shoulder 10/01/2020   Pain in joint of right hip 03/21/2019   Primary osteoarthritis involving multiple joints 04/25/2015   Last Assessment & Plan:  Relevant Hx: Course: Daily Update: Today's Plan:discussed this in depth with him and he is using some OTC meds for this when he golfs and he has tried glucosamine which did not seem to help him much at all  Electronically signed by: Eleanor Merlynn Lady, NP 04/25/15 1118   S/P right knee arthroscopy 09/14/2017   Scoliosis deformity of spine 03/23/2019   Snoring 04/25/2015   Last Assessment & Plan:  Relevant Hx: Course: Daily Update: Today's Plan:long discussion with him about this and he is advised that he should have sleep study which he does not want to and is going to investigate having mouth piece  Electronically signed by: Eleanor Merlynn Lady, NP 04/25/15 1122   Trigger thumb of right hand 10/16/2021   Past Surgical History:  Procedure Laterality Date   CORONARY ANGIOPLASTY WITH STENT PLACEMENT  08/05/2016   CORONARY STENT INTERVENTION N/A 08/05/2016   Procedure: Coronary Stent Intervention;  Surgeon: Court Dorn PARAS, MD;  Location: [facility] INVASIVE CV LAB;  Service: Cardiovascular;  Laterality: N/A;   LEFT HEART CATH AND CORONARY ANGIOGRAPHY N/A 08/05/2016   Procedure: Left Heart Cath and Coronary Angiography;  Surgeon: Court Dorn PARAS, MD;  Location: [facility] INVASIVE CV LAB;  Service: Cardiovascular;  Laterality: N/A;   SHOULDER OPEN ROTATOR CUFF REPAIR Right 1981   Allergies Allergies  Allergen Reactions   Statins Other (See Comments)    muscle aches, myalgias   Home Medications    Prior to Admission medications   Medication Sig Start Date End Date Taking? Authorizing Provider  clopidogrel  (PLAVIX ) 75 MG tablet Take 1 tablet (75 mg total) by mouth daily. 11/17/22   Monetta Redell PARAS, MD  Coenzyme Q10 (COQ10) 200 MG CAPS Take 200 mg by mouth daily. 09/02/16   Court Dorn PARAS, MD   Evolocumab  (REPATHA  SURECLICK) 140 MG/ML SOAJ Inject 140 mg into the skin every 14 (fourteen) days. 11/16/22   Monetta Redell PARAS, MD  Flaxseed, Linseed, (FLAXSEED OIL PO) Take 1 capsule by mouth daily.    [provider]  losartan (COZAAR) 50 MG tablet Take 50 mg by mouth daily. 07/28/17   [provider]  nitroGLYCERIN  (NITROSTAT ) 0.4 MG SL tablet Place 1 tablet (0.4 mg total) under the tongue every 5 (five) minutes x 3 doses as needed for chest pain. 08/06/16   Bhagat, Aleene, PA  Omega-3 Fatty Acids (FISH OIL PO) Take 1 capsule by mouth daily.     [provider]    Physical Exam   Vital Signs:  Keith Hunter does not have vital signs available for review today. Given telephonic nature of communication, physical exam is limited. AAOx3. NAD. Normal affect.  Speech and respirations are unlabored. Accessory Clinical Findings  None Assessment & Plan    1.  Preoperative Cardiovascular Risk Assessment: Keith Hunter perioperative risk of a major cardiac event is 0.9% according to the Revised Cardiac Risk Index (RCRI).  Therefore, he is at low risk for  perioperative complications.   His functional capacity is good at 7.99 METs according to the [facility] Activity Status Index (DASI). Recommendations: According to ACC/AHA guidelines, no further cardiovascular testing needed.  The patient may proceed to surgery at acceptable risk.   Antiplatelet and/or Anticoagulation Recommendations: Dr. Monetta recommends holding Plavix  for 7 days prior to his procedure. He did not recommend the use of baby aspirin  during held time. Please resume Plavix  as soon as possible at the discretion of the surgeon.   The patient was advised that if he develops new symptoms prior to surgery to contact our office to arrange for a follow-up visit, and he verbalized understanding.  A copy of this note will be routed to requesting surgeon.  Time:   Today, I have spent 10 minutes with the patient with  telehealth technology discussing medical history, symptoms, and management plan.    Keith Pienta D Rhydian Baldi, NP  10/05/2023, 4:33 PM

## 2023-10-06 ENCOUNTER — Telehealth: Payer: Self-pay | Admitting: Cardiovascular Disease

## 2023-10-06 NOTE — Telephone Encounter (Signed)
 Office received office note from Pts appointment yesterday, but it did not include the updated clearance form. Office requesting form be faxed. Please advise.

## 2023-10-06 NOTE — Telephone Encounter (Signed)
 Will re fax the Preop Clearance to surgeons office at the given fax number: [phone]

## 2023-11-03 DIAGNOSIS — R269 Unspecified abnormalities of gait and mobility: Secondary | ICD-10-CM | POA: Insufficient documentation

## 2023-11-03 DIAGNOSIS — M21371 Foot drop, right foot: Secondary | ICD-10-CM | POA: Insufficient documentation

## 2023-11-03 DIAGNOSIS — R29898 Other symptoms and signs involving the musculoskeletal system: Secondary | ICD-10-CM | POA: Insufficient documentation

## 2023-11-18 ENCOUNTER — Encounter: Payer: Self-pay | Admitting: Cardiology

## 2023-11-18 ENCOUNTER — Ambulatory Visit: Attending: Cardiology | Admitting: Cardiology

## 2023-11-18 ENCOUNTER — Other Ambulatory Visit: Payer: Self-pay | Admitting: Cardiology

## 2023-11-18 VITALS — BP 120/72 | HR 84 | Ht 70.0 in | Wt 235.6 lb

## 2023-11-18 DIAGNOSIS — E7849 Other hyperlipidemia: Secondary | ICD-10-CM

## 2023-11-18 DIAGNOSIS — I10 Essential (primary) hypertension: Secondary | ICD-10-CM

## 2023-11-18 DIAGNOSIS — I25118 Atherosclerotic heart disease of native coronary artery with other forms of angina pectoris: Secondary | ICD-10-CM

## 2023-11-18 MED ORDER — CLOPIDOGREL BISULFATE 75 MG PO TABS: 75.0000 mg | ORAL_TABLET | Freq: Every day | ORAL | 3 refills | Status: AC

## 2023-11-18 MED ORDER — REPATHA SURECLICK 140 MG/ML ~~LOC~~ SOAJ: 140.0000 mg | SUBCUTANEOUS | 2 refills | Status: DC

## 2023-11-18 NOTE — Patient Instructions (Signed)
 Medication Instructions:  Your physician recommends that you continue on your current medications as directed. Please refer to the Current Medication list given to you today.  *If you need a refill on your cardiac medications before your next appointment, please call your pharmacy*  Lab Work: Your physician recommends that you return for lab work in:   Labs today: CMP, Lipids, APO B, Hbg A1c  If you have labs (blood work) drawn today and your tests are completely normal, you will receive your results only by: MyChart Message (if you have MyChart) OR A paper copy in the mail If you have any lab test that is abnormal or we need to change your treatment, we will call you to review the results.  Testing/Procedures: None  Follow-Up: At [facility], you and your health needs are our priority.  As part of our continuing mission to provide you with exceptional heart care, our providers are all part of one team.  This team includes your primary Cardiologist (physician) and Advanced Practice Providers or APPs (Physician Assistants and Nurse Practitioners) who all work together to provide you with the care you need, when you need it.  Your next appointment:   1 year(s)  Provider:   Redell Leiter, MD    We recommend signing up for the patient portal called MyChart.  Sign up information is provided on this After Visit Summary.  MyChart is used to connect with patients for Virtual Visits (Telemedicine).  Patients are able to view lab/test results, encounter notes, upcoming appointments, etc.  Non-urgent messages can be sent to your provider as well.   To learn more about what you can do with MyChart, go to [email or web address].   Other Instructions None

## 2023-11-18 NOTE — Progress Notes (Signed)
 Cardiology Office Note:    Date:  11/18/2023   ID:  Keith Hunter, DOB [birth year], MRN [id number]  PCP:  Thurmond Cathlyn LABOR., MD  Cardiologist:  Redell Leiter, MD    Referring MD: Thurmond Cathlyn LABOR., MD    ASSESSMENT:    1. Coronary artery disease of native artery of native heart with stable angina pectoris   2. Familial hyperlipidemia   3. Essential hypertension    PLAN:    In order of problems listed above:  Keith Hunter continues to do well from a CAD perspective surgery uncomplicated he is back on clopidogrel  we will continue along with his antihypertensive losartan and Repatha  statin intolerant Blood pressure controlled Recheck labs including in April be for optimization   Next appointment: 1 year   Medication Adjustments/Labs and Tests Ordered: Current medicines are reviewed at length with the patient today.  Concerns regarding medicines are outlined above.  Orders Placed This Encounter  Procedures   EKG 12-Lead   No orders of the defined types were placed in this encounter.    History of Present Illness:    Keith Hunter is a 63 y.o. male with a hx of CAD with non-ST elevation MI in July 2018 PCI and 2 drug-eluting stents to right coronary artery hypertension familial hyperlipidemia statin intolerance last seen by me 11/16/2022.  Compliance with diet, lifestyle and medications: Yes  He had back surgery in [place] uncomplicated and is recovering No no cardiac problems and no cardiovascular symptoms chest pain shortness of breath palpitation or syncope He tolerates his lipid-lowering treatment without muscle pain or weakness I am in a recheck his labs today including A1c and a CMP. Past Medical History:  Diagnosis Date   Abnormal gait 11/03/2023   CAD (coronary artery disease), native coronary artery    a. Cath 08/05/16 - 99% pRCA & 50%mRCA s/p PTCA and  overlapping synergy drug-eluting stents   CAD S/P percutaneous coronary angioplasty 08/06/2016   RCA PCI with DES  08/06/16   Chest pain in adult 08/04/2016   Chronic right shoulder pain 10/28/2017   Class 2 severe obesity due to excess calories with serious comorbidity and body mass index (BMI) of 35.0 to 35.9 in adult 05/24/2018   Degeneration of lumbar intervertebral disc 03/23/2019   Degenerative lumbar spinal stenosis 03/23/2019   Dyslipidemia 08/21/2016   Reportedly LDL 128 at PCP's office. Pt discharged on high dose statin Rx   Essential hypertension 10/23/2016   Essential hypertension   Familial hyperlipidemia 12/14/2017   Family history of coronary artery disease 08/21/2016   Both father and brother with documented CAD   History of COVID-19 02/12/2020   01/2020     Low back pain 03/22/2019   Lumbar pain 03/23/2019   Lumbar radiculopathy 09/24/2023   Mixed hyperlipidemia 04/24/2015   Last Assessment & Plan:  Relevant Hx: Course: Daily Update: Today's Plan:this is going to be updated today and will see how he is with this  Electronically signed by: Eleanor Merlynn Lady, NP 04/25/15 1119   Myalgia due to statin 02/09/2022   Non-ST elevation (NSTEMI) myocardial infarction (HCC) 08/06/2016   Pt admitted 08/05/16 with NSTEMI-peak Troponin 0.083   NSTEMI (non-ST elevated myocardial infarction) (HCC) 08/05/2016   Obstructive sleep apnea syndrome 04/25/2015   Last Assessment & Plan:    Relevant Hx:   Course:   Daily Update:   Today's Plan:long discussion with him about this and he is advised that he should have sleep study which he does not want to and  is going to investigate having mouth piece       Electronically signed by: Eleanor Merlynn Lady, NP   04/25/15 1122     Other symptoms and signs involving the musculoskeletal system 11/03/2023   Pain in joint of left shoulder 10/01/2020   Pain in joint of right hip 03/21/2019   Prediabetes 02/20/2023   Primary osteoarthritis involving multiple joints 04/25/2015   Last Assessment & Plan:  Relevant Hx: Course: Daily Update: Today's  Plan:discussed this in depth with him and he is using some OTC meds for this when he golfs and he has tried glucosamine which did not seem to help him much at all  Electronically signed by: Eleanor Merlynn Lady, NP 04/25/15 1118   Right foot drop 11/03/2023   S/P right coronary artery (RCA) stent placement 09/24/2023   S/P right knee arthroscopy 09/14/2017   Scoliosis deformity of spine 03/23/2019   Snoring 04/25/2015   Last Assessment & Plan:  Relevant Hx: Course: Daily Update: Today's Plan:long discussion with him about this and he is advised that he should have sleep study which he does not want to and is going to investigate having mouth piece  Electronically signed by: Eleanor Merlynn Lady, NP 04/25/15 1122   Spinal stenosis of lumbar region 05/19/2019   Spondylolisthesis of lumbar region 09/24/2023   Trigger thumb of right hand 10/16/2021    Current Medications: Current Meds  Medication Sig   clopidogrel  (PLAVIX ) 75 MG tablet Take 1 tablet (75 mg total) by mouth daily.   Coenzyme Q10 (COQ10) 200 MG CAPS Take 200 mg by mouth daily.   Evolocumab  (REPATHA  SURECLICK) 140 MG/ML SOAJ Inject 140 mg into the skin every 14 (fourteen) days.   Flaxseed, Linseed, (FLAXSEED OIL PO) Take 1 capsule by mouth daily.   losartan (COZAAR) 50 MG tablet Take 50 mg by mouth daily.   nitroGLYCERIN  (NITROSTAT ) 0.4 MG SL tablet Place 1 tablet (0.4 mg total) under the tongue every 5 (five) minutes x 3 doses as needed for chest pain.   Omega-3 Fatty Acids (FISH OIL PO) Take 1 capsule by mouth daily.    pregabalin (LYRICA) 75 MG capsule Take 75 mg by mouth 2 (two) times daily.      EKGs/Labs/Other Studies Reviewed:    The following studies were reviewed today:  Cardiac Studies & Procedures   ______________________________________________________________________________________________ CARDIAC CATHETERIZATION  CARDIAC CATHETERIZATION 08/05/2016  Conclusion Images from the original result were  not included.   Mid RCA lesion, 50 %stenosed.  Prox RCA lesion, 99 %stenosed.  Post intervention, there is a 0% residual stenosis.  A stent was successfully placed.  The left ventricular systolic function is normal.  LV end diastolic pressure is normal.  The left ventricular ejection fraction is 55-65% by visual estimate.  Keith Hunter is a 63 y.o. male   [id number] LOCATION:  FACILITY: [facility] PHYSICIAN: Dorn Lesches, M.D. [birth year]   DATE OF PROCEDURE:  08/05/2016  DATE OF DISCHARGE:     CARDIAC CATHETERIZATION / RCA-PCI DES    History obtained from chart review. Keith Hunter is a very pleasant 63 year old married Caucasian male with a positive family history of heart disease but no other risk factors whose noticed several episodes of substernal chest pain with radiation to his jaw along with excessive fatigue with exertion over the last several days. He saw his PCP. A troponin was ordered that was positive for low-grade. He was told to come to the emergency room where he remained stable and pain-free. He presents now  for angiography and potential intervention.   PROCEDURE DESCRIPTION:  The patient was brought to the second floor [facility] cath lab in the postabsorptive state. He was premedicated with Valium 5 mg by mouth, IV Versed  and fentanyl . His right wrist was prepped and shaved in usual sterile fashion. Xylocaine  1% was used for local anesthesia. A 6 French sheath was inserted into the right radial artery using standard Seldinger technique. The patient received 5000 units  of heparin   intravenously.  A 5 French TIG catheter and pigtail catheter were used for selective coronary angiography and left ventriculography respectively. I said the entirety of the case. Retrograde aortic, left ventricular and pullback pressures were recorded. Radial cocktail was administered via the SideArm sheath.  180 mg of Brilenta was administered by mouth. The patient received  Angiomax  bolus followed by infusion with a demonstrated therapeutic ACT. Using a 6 French JR4 guide catheter along with an 0.14  pro-water guidewire and a 2 mm x 12 mm balloon dilatation was performed of the proximal dominant RCA. Isovue  was used for the entirety of the intervention. Retrograde aortic pressure was monitored in the case. A 3.5 mm x 20 mm long synergy drug-eluting stent was then carefully positioned across the lesion and deployed at 14 atm. There was excellent stent apposition however there was an area of narrowing just distal to the stent which appeared to be spasm but did not respond to multiple doses of intracoronary nitroglycerin . Therefore this was stented with overlapping 3.5 x 16 mm long synergy drug-eluting stent and the entire stented segment was postdilated with the stent balloon to 18 atm (3.8 mm) resulting in reduction of a 90% proximal dominant RCA stenosis to 0% residual TIMI-3 flow. The patient tolerated the procedure well. The guidewire and catheter were removed. The sheath was removed and a TR band was placed on the right wrist which is patent hemostasis. The patient left the lab in stable condition.  Impression Successful proximal/mid dominant RCA PCI and drug-eluting stenting using 2 overlapping 3.5 mm synergy drug-eluting stents postdilated to 3.8 mm resulting in reduction of a proximal 99% stenosis to 0% residual. The patient has no other significant CAD. He has normal LV function. He will be gently hydrated and discharged home in the morning on dual antiplatelet therapy will remain on uninterrupted for 12 months.  Court Carrier. MD, [facility] 08/05/2016 3:01 PM  Findings Coronary Findings Diagnostic  Dominance: Right  Right Coronary Artery  Intervention  Prox RCA lesion Angioplasty (Also treats lesions: Mid RCA) A stent was successfully placed. There is a 0% residual stenosis post intervention.  Mid RCA lesion Angioplasty (Also treats lesions: Prox RCA) See  details in Prox RCA lesion. There is a 0% residual stenosis post intervention.   STRESS TESTS  EXERCISE TOLERANCE TEST (ETT) 12/29/2017  Interpretation Summary  Blood pressure demonstrated a hypertensive response to exercise.  There was no ST segment deviation noted during stress.  1. Good exercise tolerance. 2. Hypertensive BP response. 3. No evidence for ischemia by ST segment analysis.            ______________________________________________________________________________________________        EKG Interpretation Date/Time:  Thursday November 18 2023 09:05:28 EDT Ventricular Rate:  84 PR Interval:  158 QRS Duration:  78 QT Interval:  338 QTC Calculation: 399 R Axis:   6  Text Interpretation: Normal sinus rhythm Low voltage QRS When compared with ECG of 16-Nov-2022 09:28, No significant change was found Confirmed by Monetta Rogue ([id number]) on  11/18/2023 9:10:23 AM    Recent Labs: No results found for requested labs within last 365 days.  Recent Lipid Panel    Component Value Date/Time   CHOL 92 (L) 11/16/2022 1013   TRIG 68 11/16/2022 1013   HDL 35 (L) 11/16/2022 1013   CHOLHDL 2.6 11/16/2022 1013   LDLCALC 42 11/16/2022 1013    Physical Exam:    VS:  BP 120/72   Pulse 84   Ht 5' 10 (1.778 m)   Wt 235 lb 9.6 oz (106.9 kg)   SpO2 97%   BMI 33.81 kg/m     Wt Readings from Last 3 Encounters:  11/18/23 235 lb 9.6 oz (106.9 kg)  11/16/22 234 lb 3.2 oz (106.2 kg)  10/01/21 235 lb 6.4 oz (106.8 kg)     GEN:  Well nourished, well developed in no acute distress HEENT: Normal NECK: No JVD; No carotid bruits LYMPHATICS: No lymphadenopathy CARDIAC: RRR, no murmurs, rubs, gallops RESPIRATORY:  Clear to auscultation without rales, wheezing or rhonchi  ABDOMEN: Soft, non-tender, non-distended MUSCULOSKELETAL:  No edema; No deformity  SKIN: Warm and dry NEUROLOGIC:  Alert and oriented x 3 PSYCHIATRIC:  Normal affect    Signed, Redell Leiter, MD   11/18/2023 9:08 AM    [facility]

## 2023-11-19 LAB — COMPREHENSIVE METABOLIC PANEL WITH GFR
ALT: 15 IU/L (ref 0–44)
AST: 16 IU/L (ref 0–40)
Albumin: 4 g/dL (ref 3.9–4.9)
Alkaline Phosphatase: 142 IU/L — ABNORMAL HIGH (ref 47–123)
BUN/Creatinine Ratio: 14 (ref 10–24)
BUN: 12 mg/dL (ref 8–27)
Bilirubin Total: 0.6 mg/dL (ref 0.0–1.2)
CO2: 22 mmol/L (ref 20–29)
Calcium: 9.6 mg/dL (ref 8.6–10.2)
Chloride: 105 mmol/L (ref 96–106)
Creatinine, Ser: 0.83 mg/dL (ref 0.76–1.27)
Globulin, Total: 1.7 g/dL (ref 1.5–4.5)
Glucose: 101 mg/dL — ABNORMAL HIGH (ref 70–99)
Potassium: 4.2 mmol/L (ref 3.5–5.2)
Sodium: 143 mmol/L (ref 134–144)
Total Protein: 5.7 g/dL — ABNORMAL LOW (ref 6.0–8.5)
eGFR: 100 mL/min/1.73 (ref >59)

## 2023-11-19 LAB — LIPID PANEL
Chol/HDL Ratio: 2.4 ratio (ref 0.0–5.0)
Cholesterol, Total: 112 mg/dL (ref 100–199)
HDL: 47 mg/dL (ref >39)
LDL Chol Calc (NIH): 48 mg/dL (ref 0–99)
Triglycerides: 88 mg/dL (ref 0–149)
VLDL Cholesterol Cal: 17 mg/dL (ref 5–40)

## 2023-11-19 LAB — APOLIPOPROTEIN B: Apolipoprotein B: 50 mg/dL (ref <90)

## 2023-11-19 LAB — HEMOGLOBIN A1C
Est. average glucose Bld gHb Est-mCnc: 108 mg/dL
Hgb A1c MFr Bld: 5.4 % (ref 4.8–5.6)

## 2023-11-22 ENCOUNTER — Other Ambulatory Visit: Payer: Self-pay

## 2023-12-08 ENCOUNTER — Ambulatory Visit: Payer: Self-pay

## 2023-12-13 ENCOUNTER — Other Ambulatory Visit: Payer: Self-pay

## 2023-12-13 DIAGNOSIS — E785 Hyperlipidemia, unspecified: Secondary | ICD-10-CM

## 2023-12-15 ENCOUNTER — Ambulatory Visit: Payer: Self-pay | Admitting: Cardiology

## 2023-12-15 LAB — GAMMA GT: GGT: 8 IU/L (ref 0–65)

## 2023-12-24 ENCOUNTER — Other Ambulatory Visit: Payer: Self-pay | Admitting: Cardiology

## 2023-12-24 DIAGNOSIS — E785 Hyperlipidemia, unspecified: Secondary | ICD-10-CM
